# Patient Record
Sex: Female | Born: 1950 | Hispanic: No | Smoking: Never smoker
Health system: Southern US, Community
[De-identification: ages and names within clinical notes are randomized; demographics above are authoritative.]

## PROBLEM LIST (undated history)

## (undated) DIAGNOSIS — Z973 Presence of spectacles and contact lenses: Secondary | ICD-10-CM

## (undated) DIAGNOSIS — I10 Essential (primary) hypertension: Secondary | ICD-10-CM

## (undated) DIAGNOSIS — Z8619 Personal history of other infectious and parasitic diseases: Secondary | ICD-10-CM

## (undated) DIAGNOSIS — Z98811 Dental restoration status: Secondary | ICD-10-CM

## (undated) HISTORY — PX: VULVA SURGERY: SHX837

## (undated) HISTORY — PX: ENDOMETRIAL ABLATION: SHX621

---

## 1999-06-01 ENCOUNTER — Encounter: Payer: Self-pay | Admitting: Obstetrics and Gynecology

## 1999-06-01 ENCOUNTER — Ambulatory Visit (HOSPITAL_COMMUNITY): Admission: RE | Admit: 1999-06-01 | Discharge: 1999-06-01 | Payer: Self-pay | Admitting: Obstetrics and Gynecology

## 1999-07-13 ENCOUNTER — Encounter: Payer: Self-pay | Admitting: Obstetrics and Gynecology

## 1999-07-13 ENCOUNTER — Ambulatory Visit (HOSPITAL_COMMUNITY): Admission: RE | Admit: 1999-07-13 | Discharge: 1999-07-13 | Payer: Self-pay | Admitting: Obstetrics and Gynecology

## 1999-08-03 ENCOUNTER — Encounter: Payer: Self-pay | Admitting: Obstetrics and Gynecology

## 1999-08-03 ENCOUNTER — Observation Stay (HOSPITAL_COMMUNITY): Admission: RE | Admit: 1999-08-03 | Discharge: 1999-08-04 | Payer: Self-pay | Admitting: Obstetrics and Gynecology

## 1999-09-11 ENCOUNTER — Ambulatory Visit (HOSPITAL_COMMUNITY): Admission: RE | Admit: 1999-09-11 | Discharge: 1999-09-11 | Payer: Self-pay | Admitting: Obstetrics and Gynecology

## 1999-09-11 ENCOUNTER — Encounter: Payer: Self-pay | Admitting: Obstetrics and Gynecology

## 2000-01-11 ENCOUNTER — Encounter: Payer: Self-pay | Admitting: Obstetrics and Gynecology

## 2000-01-11 ENCOUNTER — Ambulatory Visit (HOSPITAL_COMMUNITY): Admission: RE | Admit: 2000-01-11 | Discharge: 2000-01-11 | Payer: Self-pay | Admitting: Obstetrics and Gynecology

## 2000-05-10 ENCOUNTER — Other Ambulatory Visit: Admission: RE | Admit: 2000-05-10 | Discharge: 2000-05-10 | Payer: Self-pay | Admitting: Obstetrics and Gynecology

## 2005-11-22 ENCOUNTER — Other Ambulatory Visit: Admission: RE | Admit: 2005-11-22 | Discharge: 2005-11-22 | Payer: Self-pay | Admitting: Obstetrics and Gynecology

## 2006-01-29 ENCOUNTER — Encounter: Admission: RE | Admit: 2006-01-29 | Discharge: 2006-01-29 | Payer: Self-pay | Admitting: Family Medicine

## 2007-02-04 ENCOUNTER — Encounter: Admission: RE | Admit: 2007-02-04 | Discharge: 2007-02-04 | Payer: Self-pay | Admitting: Family Medicine

## 2007-03-04 ENCOUNTER — Other Ambulatory Visit: Admission: RE | Admit: 2007-03-04 | Discharge: 2007-03-04 | Payer: Self-pay | Admitting: Family Medicine

## 2008-09-24 ENCOUNTER — Emergency Department (HOSPITAL_COMMUNITY): Admission: EM | Admit: 2008-09-24 | Discharge: 2008-09-24 | Payer: Self-pay | Admitting: Emergency Medicine

## 2008-11-17 ENCOUNTER — Other Ambulatory Visit: Admission: RE | Admit: 2008-11-17 | Discharge: 2008-11-17 | Payer: Self-pay | Admitting: Family Medicine

## 2009-10-08 HISTORY — PX: DIRECT LARYNGOSCOPY: SHX5326

## 2010-03-13 ENCOUNTER — Ambulatory Visit (HOSPITAL_BASED_OUTPATIENT_CLINIC_OR_DEPARTMENT_OTHER): Admission: RE | Admit: 2010-03-13 | Discharge: 2010-03-13 | Payer: Self-pay | Admitting: Otolaryngology

## 2010-12-25 LAB — POCT I-STAT, CHEM 8
BUN: 9 mg/dL (ref 6–23)
HCT: 38 % (ref 36.0–46.0)
Hemoglobin: 12.9 g/dL (ref 12.0–15.0)
Potassium: 3.2 mEq/L — ABNORMAL LOW (ref 3.5–5.1)
Sodium: 141 mEq/L (ref 135–145)
TCO2: 30 mmol/L (ref 0–100)

## 2011-08-10 ENCOUNTER — Other Ambulatory Visit (HOSPITAL_COMMUNITY)
Admission: RE | Admit: 2011-08-10 | Discharge: 2011-08-10 | Disposition: A | Discharge status: Home / Self Care | Payer: PRIVATE HEALTH INSURANCE | Source: Ambulatory Visit | Attending: Family Medicine | Admitting: Family Medicine

## 2011-08-10 ENCOUNTER — Other Ambulatory Visit: Payer: Self-pay | Admitting: Physician Assistant

## 2011-08-10 DIAGNOSIS — Z0000 Encounter for general adult medical examination without abnormal findings: Secondary | ICD-10-CM | POA: Insufficient documentation

## 2011-08-24 ENCOUNTER — Other Ambulatory Visit: Payer: Self-pay | Admitting: Surgery

## 2013-11-06 ENCOUNTER — Emergency Department (HOSPITAL_COMMUNITY)
Admission: EM | Admit: 2013-11-06 | Discharge: 2013-11-06 | Disposition: A | Discharge status: Home / Self Care | Payer: PRIVATE HEALTH INSURANCE | Attending: Emergency Medicine | Admitting: Emergency Medicine

## 2013-11-06 ENCOUNTER — Encounter (HOSPITAL_COMMUNITY): Payer: Self-pay | Admitting: Emergency Medicine

## 2013-11-06 ENCOUNTER — Emergency Department (HOSPITAL_COMMUNITY): Payer: PRIVATE HEALTH INSURANCE

## 2013-11-06 DIAGNOSIS — S0993XA Unspecified injury of face, initial encounter: Secondary | ICD-10-CM | POA: Insufficient documentation

## 2013-11-06 DIAGNOSIS — I10 Essential (primary) hypertension: Secondary | ICD-10-CM | POA: Insufficient documentation

## 2013-11-06 DIAGNOSIS — Z23 Encounter for immunization: Secondary | ICD-10-CM | POA: Insufficient documentation

## 2013-11-06 DIAGNOSIS — Y92410 Unspecified street and highway as the place of occurrence of the external cause: Secondary | ICD-10-CM | POA: Insufficient documentation

## 2013-11-06 DIAGNOSIS — S62009A Unspecified fracture of navicular [scaphoid] bone of unspecified wrist, initial encounter for closed fracture: Secondary | ICD-10-CM | POA: Insufficient documentation

## 2013-11-06 DIAGNOSIS — S62001A Unspecified fracture of navicular [scaphoid] bone of right wrist, initial encounter for closed fracture: Secondary | ICD-10-CM

## 2013-11-06 DIAGNOSIS — Y9389 Activity, other specified: Secondary | ICD-10-CM | POA: Insufficient documentation

## 2013-11-06 DIAGNOSIS — S199XXA Unspecified injury of neck, initial encounter: Secondary | ICD-10-CM

## 2013-11-06 HISTORY — DX: Essential (primary) hypertension: I10

## 2013-11-06 MED ORDER — IBUPROFEN 800 MG PO TABS: 800.0000 mg | ORAL_TABLET | Freq: Three times a day (TID) | ORAL | Status: DC

## 2013-11-06 MED ORDER — HYDROCODONE-ACETAMINOPHEN 5-325 MG PO TABS: 1.0000 | ORAL_TABLET | ORAL | Status: DC | PRN

## 2013-11-06 MED ORDER — TETANUS-DIPHTH-ACELL PERTUSSIS 5-2.5-18.5 LF-MCG/0.5 IM SUSP
0.5000 mL | Freq: Once | INTRAMUSCULAR | Status: AC
  Administered 2013-11-06: 0.5 mL via INTRAMUSCULAR
  Filled 2013-11-06: qty 0.5

## 2013-11-06 MED ORDER — ONDANSETRON 4 MG PO TBDP
4.0000 mg | ORAL_TABLET | Freq: Once | ORAL | Status: AC
Start: 2013-11-06 — End: 2013-11-06
  Administered 2013-11-06: 4 mg via ORAL
  Filled 2013-11-06: qty 1

## 2013-11-06 MED ORDER — IBUPROFEN 800 MG PO TABS
800.0000 mg | ORAL_TABLET | Freq: Once | ORAL | Status: AC
  Administered 2013-11-06: 800 mg via ORAL
  Filled 2013-11-06: qty 1
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
## 2013-11-06 NOTE — Progress Notes (Signed)
Orthopedic Tech Progress Note Patient Details:  Kara Nelson January 08, 1951 [id number]  Ortho Devices Type of Ortho Device: Thumb velcro splint Ortho Device/Splint Location: RUE Ortho Device/Splint Interventions: Ordered;Application   Braulio Bosch 11/06/2013, 9:38 PM

## 2013-11-06 NOTE — Discharge Instructions (Signed)
Follow up with the hand surgeon as scheduled.  Take motrin or tylenol as needed for pain. Scaphoid Fracture, Wrist A fracture is a break in the bone. The bone you have broken often does not show up as a fracture on x-ray until later on in the healing phase. This bone is called the scaphoid bone. With this bone, your caregiver will often cast or splint your wrist as though it is fractured, even if a fracture is not seen on the x-ray. This is often done with wrist injuries in which there is tenderness at the base of the thumb. An x-ray at 1-3 weeks after your injury may confirm this fracture. A cast or splint is used to protect and keep your injured bone in good position for healing. The cast or splint will be on generally for about 6 to 16 weeks, depending on your health, age, the fracture location and how quickly you heal. Another name for the scaphoid bone is the navicular bone. HOME CARE INSTRUCTIONS   To lessen the swelling and pain, keep the injured part elevated above your heart while sitting or lying down.  Apply ice to the injury for 15-20 minutes, 03-04 times per day while awake, for 2 days. Put the ice in a plastic bag and place a thin towel between the bag of ice and your cast.  If you have a plaster or fiberglass cast or splint:  Do not try to scratch the skin under the cast using sharp or pointed objects.  Check the skin around the cast every day. You may put lotion on any red or sore areas.  Keep your cast or splint dry and clean.  If you have a plaster splint:  Wear the splint as directed.  You may loosen the elastic bandage around the splint if your fingers become numb, tingle, or turn cold or blue.  If you have been put in a removable splint, wear and use as directed.  Do not put pressure on any part of your cast or splint; it may deform or break. Rest your cast or splint only on a pillow the first 24 hours until it is fully hardened.  Your cast or splint can be protected  during bathing with a plastic bag. Do not lower the cast or splint into water.  Only take over-the-counter or prescription medicines for pain, discomfort, or fever as directed by your caregiver.  If your caregiver has given you a follow up appointment, it is very important to keep that appointment. Not keeping the appointment could result in chronic pain and decreased function. If there is any problem keeping the appointment, you must call back to this facility for assistance. SEEK IMMEDIATE MEDICAL CARE IF:   Your cast gets damaged, wet or breaks.  You have continued severe pain or more swelling than you did before the cast or splint was put on.  Your skin or nails below the injury turn blue or gray, or feel cold or numb.  You have tingling or burning pain in your fingers or increasing pain with movement of your fingers Document Released: 09/14/2002 Document Revised: 12/17/2011 Document Reviewed: 05/13/2009 [facility] Patient Information 2014 [place].
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
## 2014-01-05 ENCOUNTER — Other Ambulatory Visit: Payer: Self-pay | Admitting: Orthopaedic Surgery

## 2014-01-05 DIAGNOSIS — M25531 Pain in right wrist: Secondary | ICD-10-CM

## 2014-01-11 ENCOUNTER — Ambulatory Visit
Admission: RE | Admit: 2014-01-11 | Discharge: 2014-01-11 | Disposition: A | Discharge status: Home / Self Care | Payer: BC Managed Care – PPO | Source: Ambulatory Visit | Attending: Orthopaedic Surgery | Admitting: Orthopaedic Surgery

## 2014-01-11 DIAGNOSIS — M25531 Pain in right wrist: Secondary | ICD-10-CM

## 2014-02-04 ENCOUNTER — Encounter (HOSPITAL_BASED_OUTPATIENT_CLINIC_OR_DEPARTMENT_OTHER)
Admission: RE | Admit: 2014-02-04 | Discharge: 2014-02-04 | Disposition: A | Discharge status: Home / Self Care | Payer: BC Managed Care – PPO | Source: Ambulatory Visit | Attending: Orthopedic Surgery | Admitting: Orthopedic Surgery

## 2014-02-04 ENCOUNTER — Encounter (HOSPITAL_BASED_OUTPATIENT_CLINIC_OR_DEPARTMENT_OTHER): Payer: Self-pay | Admitting: *Deleted

## 2014-02-04 DIAGNOSIS — Z01810 Encounter for preprocedural cardiovascular examination: Secondary | ICD-10-CM | POA: Insufficient documentation

## 2014-02-04 DIAGNOSIS — Z01812 Encounter for preprocedural laboratory examination: Secondary | ICD-10-CM | POA: Insufficient documentation

## 2014-02-04 LAB — BASIC METABOLIC PANEL
BUN: 15 mg/dL (ref 6–23)
CHLORIDE: 102 meq/L (ref 96–112)
CO2: 25 meq/L (ref 19–32)
Calcium: 8.6 mg/dL (ref 8.4–10.5)
Creatinine, Ser: 0.63 mg/dL (ref 0.50–1.10)
GFR calc Af Amer: >90 mL/min (ref >90)
GFR calc non Af Amer: >90 mL/min (ref >90)
GLUCOSE: 139 mg/dL — AB (ref 70–99)
Potassium: 3.6 mEq/L — ABNORMAL LOW (ref 3.7–5.3)
SODIUM: 140 meq/L (ref 137–147)

## 2014-02-04 NOTE — Progress Notes (Signed)
EKG reviewed by Dr Chriss Driver. No new orders
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
## 2014-02-05 ENCOUNTER — Other Ambulatory Visit: Payer: Self-pay | Admitting: Orthopedic Surgery
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
## 2014-02-09 ENCOUNTER — Encounter (HOSPITAL_BASED_OUTPATIENT_CLINIC_OR_DEPARTMENT_OTHER)
Admission: RE | Disposition: A | Discharge status: Home / Self Care | Payer: Self-pay | Source: Ambulatory Visit | Attending: Orthopedic Surgery

## 2014-02-09 ENCOUNTER — Ambulatory Visit (HOSPITAL_BASED_OUTPATIENT_CLINIC_OR_DEPARTMENT_OTHER)
Admission: RE | Admit: 2014-02-09 | Discharge: 2014-02-09 | Disposition: A | Discharge status: Home / Self Care | Payer: BC Managed Care – PPO | Source: Ambulatory Visit | Attending: Orthopedic Surgery | Admitting: Orthopedic Surgery

## 2014-02-09 ENCOUNTER — Encounter (HOSPITAL_BASED_OUTPATIENT_CLINIC_OR_DEPARTMENT_OTHER): Payer: Self-pay | Admitting: Orthopedic Surgery

## 2014-02-09 ENCOUNTER — Ambulatory Visit (HOSPITAL_BASED_OUTPATIENT_CLINIC_OR_DEPARTMENT_OTHER): Payer: BC Managed Care – PPO | Admitting: Anesthesiology

## 2014-02-09 ENCOUNTER — Encounter (HOSPITAL_BASED_OUTPATIENT_CLINIC_OR_DEPARTMENT_OTHER): Payer: BC Managed Care – PPO | Admitting: Anesthesiology

## 2014-02-09 DIAGNOSIS — Z9119 Patient's noncompliance with other medical treatment and regimen: Secondary | ICD-10-CM | POA: Insufficient documentation

## 2014-02-09 DIAGNOSIS — B029 Zoster without complications: Secondary | ICD-10-CM | POA: Insufficient documentation

## 2014-02-09 DIAGNOSIS — Z91199 Patient's noncompliance with other medical treatment and regimen due to unspecified reason: Secondary | ICD-10-CM | POA: Insufficient documentation

## 2014-02-09 DIAGNOSIS — IMO0002 Reserved for concepts with insufficient information to code with codable children: Secondary | ICD-10-CM | POA: Insufficient documentation

## 2014-02-09 DIAGNOSIS — Z885 Allergy status to narcotic agent status: Secondary | ICD-10-CM | POA: Insufficient documentation

## 2014-02-09 DIAGNOSIS — Z9109 Other allergy status, other than to drugs and biological substances: Secondary | ICD-10-CM | POA: Insufficient documentation

## 2014-02-09 DIAGNOSIS — I10 Essential (primary) hypertension: Secondary | ICD-10-CM | POA: Insufficient documentation

## 2014-02-09 DIAGNOSIS — Z91018 Allergy to other foods: Secondary | ICD-10-CM | POA: Insufficient documentation

## 2014-02-09 HISTORY — PX: ORIF WRIST FRACTURE: SHX2133

## 2014-02-09 HISTORY — DX: Personal history of other infectious and parasitic diseases: Z86.19

## 2014-02-09 HISTORY — DX: Dental restoration status: Z98.811

## 2014-02-09 HISTORY — DX: Presence of spectacles and contact lenses: Z97.3

## 2014-02-09 LAB — POCT HEMOGLOBIN-HEMACUE: Hemoglobin: 13.7 g/dL (ref 12.0–15.0)

## 2014-02-09 SURGERY — OPEN REDUCTION INTERNAL FIXATION (ORIF) WRIST FRACTURE
Anesthesia: General | Site: Wrist | Laterality: Right

## 2014-02-09 MED ORDER — CEFAZOLIN SODIUM-DEXTROSE 2-3 GM-% IV SOLR: 2.0000 g | INTRAVENOUS | Status: DC

## 2014-02-09 MED ORDER — LIDOCAINE HCL (CARDIAC) 20 MG/ML IV SOLN
INTRAVENOUS | Status: DC | PRN
  Administered 2014-02-09: 40 mg via INTRAVENOUS

## 2014-02-09 MED ORDER — VANCOMYCIN HCL 500 MG IV SOLR: 500.0000 mg | INTRAVENOUS | Status: DC

## 2014-02-09 MED ORDER — FENTANYL CITRATE 0.05 MG/ML IJ SOLN
INTRAMUSCULAR | Status: DC | PRN
  Administered 2014-02-09: 50 ug via INTRAVENOUS

## 2014-02-09 MED ORDER — FENTANYL CITRATE 0.05 MG/ML IJ SOLN
INTRAMUSCULAR | Status: AC
  Filled 2014-02-09: qty 4

## 2014-02-09 MED ORDER — MIDAZOLAM HCL 2 MG/2ML IJ SOLN
INTRAMUSCULAR | Status: AC
  Filled 2014-02-09: qty 2

## 2014-02-09 MED ORDER — VANCOMYCIN HCL IN DEXTROSE 500-5 MG/100ML-% IV SOLN
INTRAVENOUS | Status: AC
  Filled 2014-02-09: qty 100

## 2014-02-09 MED ORDER — ONDANSETRON HCL 4 MG/2ML IJ SOLN: 4.0000 mg | Freq: Once | INTRAMUSCULAR | Status: DC | PRN

## 2014-02-09 MED ORDER — TRAMADOL HCL 50 MG PO TABS: ORAL_TABLET | ORAL | Status: DC

## 2014-02-09 MED ORDER — MIDAZOLAM HCL 2 MG/2ML IJ SOLN
1.0000 mg | INTRAMUSCULAR | Status: DC | PRN
  Administered 2014-02-09: 2 mg via INTRAVENOUS

## 2014-02-09 MED ORDER — PROPOFOL 10 MG/ML IV BOLUS
INTRAVENOUS | Status: DC | PRN
  Administered 2014-02-09: 100 mg via INTRAVENOUS

## 2014-02-09 MED ORDER — MIDAZOLAM HCL 5 MG/5ML IJ SOLN
INTRAMUSCULAR | Status: DC | PRN
  Administered 2014-02-09: 2 mg via INTRAVENOUS

## 2014-02-09 MED ORDER — CEPHALEXIN 500 MG PO CAPS: 500.0000 mg | ORAL_CAPSULE | Freq: Three times a day (TID) | ORAL | Status: DC

## 2014-02-09 MED ORDER — DOXYCYCLINE HYCLATE 100 MG PO TABS: 100.0000 mg | ORAL_TABLET | Freq: Two times a day (BID) | ORAL | Status: DC

## 2014-02-09 MED ORDER — FENTANYL CITRATE 0.05 MG/ML IJ SOLN
INTRAMUSCULAR | Status: AC
  Filled 2014-02-09: qty 2

## 2014-02-09 MED ORDER — METHYLPREDNISOLONE ACETATE 40 MG/ML IJ SUSP
INTRAMUSCULAR | Status: AC
  Filled 2014-02-09: qty 1

## 2014-02-09 MED ORDER — CEFAZOLIN SODIUM-DEXTROSE 2-3 GM-% IV SOLR
INTRAVENOUS | Status: AC
  Filled 2014-02-09: qty 50

## 2014-02-09 MED ORDER — FENTANYL CITRATE 0.05 MG/ML IJ SOLN: 25.0000 ug | INTRAMUSCULAR | Status: DC | PRN

## 2014-02-09 MED ORDER — LIDOCAINE HCL 2 % IJ SOLN
INTRAMUSCULAR | Status: AC
  Filled 2014-02-09: qty 20

## 2014-02-09 MED ORDER — LACTATED RINGERS IV SOLN
INTRAVENOUS | Status: DC
  Administered 2014-02-09 (×2): via INTRAVENOUS

## 2014-02-09 MED ORDER — CHLORHEXIDINE GLUCONATE 4 % EX LIQD: 60.0000 mL | Freq: Once | CUTANEOUS | Status: DC

## 2014-02-09 MED ORDER — FENTANYL CITRATE 0.05 MG/ML IJ SOLN
50.0000 ug | INTRAMUSCULAR | Status: DC | PRN
  Administered 2014-02-09: 100 ug via INTRAVENOUS

## 2014-02-09 MED ORDER — VANCOMYCIN HCL 1000 MG IV SOLR
500.0000 mg | INTRAVENOUS | Status: DC | PRN
  Administered 2014-02-09: 500 mg via INTRAVENOUS

## 2014-02-09 MED ORDER — DEXAMETHASONE SODIUM PHOSPHATE 4 MG/ML IJ SOLN
INTRAMUSCULAR | Status: DC | PRN
  Administered 2014-02-09: 10 mg via INTRAVENOUS

## 2014-02-09 SURGICAL SUPPLY — 68 items
BAG DECANTER FOR FLEXI CONT (MISCELLANEOUS) IMPLANT
BANDAGE ADH SHEER 1  50/CT (GAUZE/BANDAGES/DRESSINGS) IMPLANT
BANDAGE ELASTIC 3 VELCRO ST LF (GAUZE/BANDAGES/DRESSINGS) ×4 IMPLANT
BIT DRILL MICR ACTRK 2 LNG PRF (BIT) IMPLANT
BLADE 15 SAFETY STRL DISP (BLADE) ×2 IMPLANT
BLADE MINI RND TIP GREEN BEAV (BLADE) ×1 IMPLANT
BLADE SURG 15 STRL LF DISP TIS (BLADE) IMPLANT
BLADE SURG 15 STRL SS (BLADE) ×2
BNDG CMPR 9X4 STRL LF SNTH (GAUZE/BANDAGES/DRESSINGS) ×1
BNDG COHESIVE 3X5 TAN STRL LF (GAUZE/BANDAGES/DRESSINGS) IMPLANT
BNDG ESMARK 4X9 LF (GAUZE/BANDAGES/DRESSINGS) ×2 IMPLANT
BNDG GAUZE ELAST 4 BULKY (GAUZE/BANDAGES/DRESSINGS) ×2 IMPLANT
BRUSH SCRUB EZ PLAIN DRY (MISCELLANEOUS) ×2 IMPLANT
CANISTER SUCT 1200ML W/VALVE (MISCELLANEOUS) ×1 IMPLANT
CORDS BIPOLAR (ELECTRODE) ×2 IMPLANT
COVER MAYO STAND STRL (DRAPES) ×2 IMPLANT
COVER TABLE BACK 60X90 (DRAPES) ×2 IMPLANT
CUFF TOURNIQUET SINGLE 18IN (TOURNIQUET CUFF) ×1 IMPLANT
DECANTER SPIKE VIAL GLASS SM (MISCELLANEOUS) IMPLANT
DRAPE EXTREMITY T 121X128X90 (DRAPE) ×2 IMPLANT
DRAPE OEC MINIVIEW 54X84 (DRAPES) ×2 IMPLANT
DRAPE SURG 17X23 STRL (DRAPES) ×2 IMPLANT
DRILL MICRO ACUTRAK 2 LNG PROF (BIT) ×2
GAUZE SPONGE 4X4 12PLY STRL (GAUZE/BANDAGES/DRESSINGS) ×2 IMPLANT
GAUZE XEROFORM 1X8 LF (GAUZE/BANDAGES/DRESSINGS) IMPLANT
GLOVE BIO SURGEON STRL SZ 6.5 (GLOVE) ×1 IMPLANT
GLOVE BIOGEL M STRL SZ7.5 (GLOVE) ×2 IMPLANT
GLOVE BIOGEL PI IND STRL 7.0 (GLOVE) IMPLANT
GLOVE BIOGEL PI INDICATOR 7.0 (GLOVE) ×1
GLOVE EXAM NITRILE MD LF STRL (GLOVE) ×1 IMPLANT
GLOVE ORTHO TXT STRL SZ7.5 (GLOVE) ×2 IMPLANT
GOWN STRL REUS W/ TWL LRG LVL3 (GOWN DISPOSABLE) ×1 IMPLANT
GOWN STRL REUS W/ TWL XL LVL3 (GOWN DISPOSABLE) ×2 IMPLANT
GOWN STRL REUS W/TWL LRG LVL3 (GOWN DISPOSABLE) ×2
GOWN STRL REUS W/TWL XL LVL3 (GOWN DISPOSABLE) ×4
K-WIRE .035X4 (WIRE) ×2 IMPLANT
LOOP VESSEL MAXI BLUE (MISCELLANEOUS) IMPLANT
NEEDLE 27GAX1X1/2 (NEEDLE) ×1 IMPLANT
NS IRRIG 1000ML POUR BTL (IV SOLUTION) ×2 IMPLANT
PACK BASIN DAY SURGERY FS (CUSTOM PROCEDURE TRAY) ×2 IMPLANT
PAD CAST 3X4 CTTN HI CHSV (CAST SUPPLIES) ×2 IMPLANT
PADDING CAST ABS 4INX4YD NS (CAST SUPPLIES) ×1
PADDING CAST ABS COTTON 4X4 ST (CAST SUPPLIES) ×1 IMPLANT
PADDING CAST COTTON 3X4 STRL (CAST SUPPLIES) ×4
SCREW ACUTRAK 2 MICRO 20MM (Screw) ×1 IMPLANT
SLEEVE SCD COMPRESS KNEE MED (MISCELLANEOUS) ×1 IMPLANT
SPLINT PLASTER CAST XFAST 3X15 (CAST SUPPLIES) IMPLANT
SPLINT PLASTER XTRA FASTSET 3X (CAST SUPPLIES) ×10
STOCKINETTE 4X48 STRL (DRAPES) ×2 IMPLANT
STRIP CLOSURE SKIN 1/2X4 (GAUZE/BANDAGES/DRESSINGS) ×2 IMPLANT
SUCTION FRAZIER TIP 10 FR DISP (SUCTIONS) IMPLANT
SUT ETHIBOND 3-0 V-5 (SUTURE) IMPLANT
SUT MERSILENE 4 0 P 3 (SUTURE) ×1 IMPLANT
SUT PROLENE 3 0 PS 2 (SUTURE) ×2 IMPLANT
SUT VIC AB 0 SH 27 (SUTURE) ×1 IMPLANT
SUT VIC AB 2-0 PS2 27 (SUTURE) ×1 IMPLANT
SUT VIC AB 3-0 SH 27 (SUTURE)
SUT VIC AB 3-0 SH 27X BRD (SUTURE) IMPLANT
SUT VIC AB 4-0 BRD 54 (SUTURE) IMPLANT
SUT VIC AB 4-0 P-3 18XBRD (SUTURE) IMPLANT
SUT VIC AB 4-0 P3 18 (SUTURE) ×2
SYR 3ML 23GX1 SAFETY (SYRINGE) IMPLANT
SYR BULB 3OZ (MISCELLANEOUS) ×2 IMPLANT
SYR CONTROL 10ML LL (SYRINGE) ×1 IMPLANT
TOWEL OR 17X24 6PK STRL BLUE (TOWEL DISPOSABLE) ×2 IMPLANT
TRAY DSU PREP LF (CUSTOM PROCEDURE TRAY) ×2 IMPLANT
TUBE CONNECTING 20X1/4 (TUBING) ×1 IMPLANT
UNDERPAD 30X30 INCONTINENT (UNDERPADS AND DIAPERS) ×2 IMPLANT

## 2014-02-09 NOTE — Progress Notes (Signed)
Assisted Dr. Linna Caprice with right, ultrasound guided, supraclavicular block. Side rails up, monitors on throughout procedure. See vital signs in flow sheet. Tolerated Procedure well.

## 2014-02-09 NOTE — Transfer of Care (Signed)
Immediate Anesthesia Transfer of Care Note  Patient: Kara Nelson  Procedure(s) Performed: Procedure(s) with comments: OPEN REDUCTION INTERNAL FIXATION (ORIF)/ ACUTRAK SCREW FIXATION RIGHT SCAPHOID (Right) - ANESTHESIA:  GENERAL/REGIONAL BLOCK  Patient Location: PACU  Anesthesia Type:GA combined with regional for post-op pain  Level of Consciousness: sedated  Airway & Oxygen Therapy: Patient Spontanous Breathing and Patient connected to face mask oxygen  Post-op Assessment: Report given to PACU RN and Post -op Vital signs reviewed and stable  Post vital signs: Reviewed and stable  Complications: No apparent anesthesia complications

## 2014-02-09 NOTE — Op Note (Signed)
[id number] 

## 2014-02-09 NOTE — Anesthesia Procedure Notes (Addendum)
Anesthesia Regional Block:  Interscalene brachial plexus block  Pre-Anesthetic Checklist: ,, timeout performed, Correct Patient, Correct Site, Correct Laterality, Correct Procedure, Correct Position, site marked, Risks and benefits discussed,  Surgical consent,  Pre-op evaluation,  At surgeon's request and post-op pain management  Laterality: Right  Prep: chloraprep       Needles:  Injection technique: Single-shot     Needle Length: 9cm 9 cm Needle Gauge: 22 and 22 G    Additional Needles: Interscalene brachial plexus block Narrative:  Start time: 02/09/2014 8:00 AM End time: 02/09/2014 8:05 AM Injection made incrementally with aspirations every 5 mL.  Performed by: Personally   Additional Notes: 20 cc naropin 0.5%  Injected easily   Procedure Name: LMA Insertion Date/Time: 02/09/2014 8:37 AM Performed by: Toula Moos L Pre-anesthesia Checklist: Patient identified, Emergency Drugs available, Suction available, Patient being monitored and Timeout performed Patient Re-evaluated:Patient Re-evaluated prior to inductionOxygen Delivery Method: Circle System Utilized Preoxygenation: Pre-oxygenation with 100% oxygen Intubation Type: IV induction Ventilation: Mask ventilation without difficulty LMA: LMA inserted LMA Size: 3.0 Number of attempts: 1 Airway Equipment and Method: bite block Placement Confirmation: positive ETCO2 and breath sounds checked- equal and bilateral Tube secured with: Tape Dental Injury: Teeth and Oropharynx as per pre-operative assessment

## 2014-02-09 NOTE — Brief Op Note (Signed)
02/09/2014  9:29 AM  PATIENT:  Skeet Simmer  63 y.o. female  PRE-OPERATIVE DIAGNOSIS:  RIGHT WRIST SCAPHOID NON UNION  POST-OPERATIVE DIAGNOSIS:  RIGHT WRIST SCAPHOID NON UNION  PROCEDURE:  Procedure(s) with comments: OPEN REDUCTION INTERNAL FIXATION (ORIF)/ ACUTRAK SCREW FIXATION RIGHT SCAPHOID (Right) - ANESTHESIA:  GENERAL/REGIONAL BLOCK  SURGEON:  Surgeon(s) and Role:    * Cammie Sickle., MD - Primary  PHYSICIAN ASSISTANT:   ASSISTANTS: Kathyrn Sheriff.A-C    ANESTHESIA:   general  EBL:  Total I/O In: 900 [I.V.:900] Out: -   BLOOD ADMINISTERED:none  DRAINS: none   LOCAL MEDICATIONS USED:  Regional block  SPECIMEN:  No Specimen  DISPOSITION OF SPECIMEN:  N/A  COUNTS:  YES  TOURNIQUET:  * Missing tourniquet times found for documented tourniquets in log:  [id number] *  DICTATION: .Other Dictation: Dictation Number [id number]  PLAN OF CARE: Discharge to home after PACU  PATIENT DISPOSITION:  PACU - hemodynamically stable.   Delay start of Pharmacological VTE agent (>24hrs) due to surgical blood loss or risk of bleeding: not applicable

## 2014-02-09 NOTE — Discharge Instructions (Addendum)
[facility] Center Instructions Hand Surgery  Wound Care: Keep your hand elevated above the level of your heart.  Do not allow it to dangle by your side.  Keep the dressing dry and do not remove it unless your doctor advises you to do so.  He will usually change it at the time of your post-op visit.  Moving your fingers is advised to stimulate circulation but will depend on the site of your surgery.  If you have a splint applied, your doctor will advise you regarding movement.  Activity: Do not drive or operate machinery today.  Rest today and then you may return to your normal activity and work as indicated by your physician.  Diet:  Drink liquids today or eat a light diet.  You may resume a regular diet tomorrow.    General expectations: Pain for two to three days. Fingers may become slightly swollen.  Call your doctor if any of the following occur: Severe pain not relieved by pain medication. Elevated temperature. Dressing soaked with blood. Inability to move fingers. White or bluish color to fingers.    Post Anesthesia Home Care Instructions  Activity: Get plenty of rest for the remainder of the day. A responsible adult should stay with you for 24 hours following the procedure.  For the next 24 hours, DO NOT: -Drive a car -Paediatric nurse -Drink alcoholic beverages -Take any medication unless instructed by your physician -Make any legal decisions or sign important papers.  Meals: Start with liquid foods such as gelatin or soup. Progress to regular foods as tolerated. Avoid greasy, spicy, heavy foods. If nausea and/or vomiting occur, drink only clear liquids until the nausea and/or vomiting subsides. Call your physician if vomiting continues.  Special Instructions/Symptoms: Your throat may feel dry or sore from the anesthesia or the breathing tube placed in your throat during surgery. If this causes discomfort, gargle with warm salt water. The discomfort should disappear within  24 hours.  Regional Anesthesia Blocks  1. Numbness or the inability to move the "blocked" extremity may last from 3-48 hours after placement. The length of time depends on the medication injected and your individual response to the medication. If the numbness is not going away after 48 hours, call your surgeon.  2. The extremity that is blocked will need to be protected until the numbness is gone and the  Strength has returned. Because you cannot feel it, you will need to take extra care to avoid injury. Because it may be weak, you may have difficulty moving it or using it. You may not know what position it is in without looking at it while the block is in effect.  3. For blocks in the legs and feet, returning to weight bearing and walking needs to be done carefully. You will need to wait until the numbness is entirely gone and the strength has returned. You should be able to move your leg and foot normally before you try and bear weight or walk. You will need someone to be with you when you first try to ensure you do not fall and possibly risk injury.  4. Bruising and tenderness at the needle site are common side effects and will resolve in a few days.  5. Persistent numbness or new problems with movement should be communicated to the surgeon or the [place] [phone] [place] [phone]).  Call your surgeon if you experience:   1.  Fever over 101.0. 2.  Inability to urinate.  3.  Nausea and/or vomiting. 4.  Extreme swelling or bruising at the surgical site. 5.  Continued bleeding from the incision. 6.  Increased pain, redness or drainage from the incision. 7.  Problems related to your pain medication.

## 2014-02-09 NOTE — Anesthesia Preprocedure Evaluation (Signed)
Anesthesia Evaluation  Patient identified by MRN, date of birth, ID band Patient awake    Reviewed: Allergy & Precautions, H&P , NPO status , Patient's Chart, lab work & pertinent test results  Airway Mallampati: II TM Distance: >3 FB     Dental  (+) Teeth Intact, Dental Advisory Given   Pulmonary  breath sounds clear to auscultation        Cardiovascular hypertension, Rhythm:Regular Rate:Normal     Neuro/Psych    GI/Hepatic   Endo/Other    Renal/GU      Musculoskeletal   Abdominal   Peds  Hematology   Anesthesia Other Findings   Reproductive/Obstetrics                           Anesthesia Physical Anesthesia Plan  ASA: II  Anesthesia Plan: General   Post-op Pain Management:    Induction: Intravenous  Airway Management Planned: LMA  Additional Equipment:   Intra-op Plan:   Post-operative Plan:   Informed Consent: I have reviewed the patients History and Physical, chart, labs and discussed the procedure including the risks, benefits and alternatives for the proposed anesthesia with the patient or authorized representative who has indicated his/her understanding and acceptance.   Dental advisory given  Plan Discussed with: CRNA and Anesthesiologist  Anesthesia Plan Comments:         Anesthesia Quick Evaluation

## 2014-02-09 NOTE — Progress Notes (Signed)
Anesthesiology The block performed was a Right supraclavicular block, not an interscalene block.

## 2014-02-09 NOTE — Anesthesia Postprocedure Evaluation (Signed)
  Anesthesia Post-op Note  Patient: Kara Nelson  Procedure(s) Performed: Procedure(s) with comments: OPEN REDUCTION INTERNAL FIXATION (ORIF)/ ACUTRAK SCREW FIXATION RIGHT SCAPHOID (Right) - ANESTHESIA:  GENERAL/REGIONAL BLOCK  Patient Location: PACU  Anesthesia Type:General and GA combined with regional for post-op pain  Level of Consciousness: awake, alert  and oriented  Airway and Oxygen Therapy: Patient Spontanous Breathing and Patient connected to nasal cannula oxygen  Post-op Pain: mild  Post-op Assessment: Post-op Vital signs reviewed, Patient's Cardiovascular Status Stable, Respiratory Function Stable, Patent Airway and Pain level controlled  Post-op Vital Signs: stable  Last Vitals:  Filed Vitals:   02/09/14 1130  BP: 164/88  Pulse: 66  Temp: 36.6 C  Resp: 16    Complications: No apparent anesthesia complications

## 2014-02-10 ENCOUNTER — Encounter (HOSPITAL_BASED_OUTPATIENT_CLINIC_OR_DEPARTMENT_OTHER): Payer: Self-pay | Admitting: Orthopedic Surgery
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
## 2014-08-05 ENCOUNTER — Other Ambulatory Visit: Payer: Self-pay | Admitting: Obstetrics and Gynecology

## 2014-08-05 ENCOUNTER — Other Ambulatory Visit (HOSPITAL_COMMUNITY)
Admission: RE | Admit: 2014-08-05 | Discharge: 2014-08-05 | Disposition: A | Discharge status: Home / Self Care | Payer: BC Managed Care – PPO | Source: Ambulatory Visit | Attending: Obstetrics and Gynecology | Admitting: Obstetrics and Gynecology

## 2014-08-05 DIAGNOSIS — Z01419 Encounter for gynecological examination (general) (routine) without abnormal findings: Secondary | ICD-10-CM | POA: Diagnosis present

## 2014-08-05 DIAGNOSIS — Z1151 Encounter for screening for human papillomavirus (HPV): Secondary | ICD-10-CM | POA: Insufficient documentation

## 2014-08-06 LAB — CYTOLOGY - PAP

## 2014-09-23 ENCOUNTER — Encounter (HOSPITAL_COMMUNITY)
Admission: RE | Admit: 2014-09-23 | Discharge: 2014-09-23 | Disposition: A | Discharge status: Home / Self Care | Payer: BC Managed Care – PPO | Source: Ambulatory Visit | Attending: Obstetrics and Gynecology | Admitting: Obstetrics and Gynecology

## 2014-09-23 ENCOUNTER — Encounter (HOSPITAL_COMMUNITY): Payer: Self-pay

## 2014-09-23 DIAGNOSIS — Z01812 Encounter for preprocedural laboratory examination: Secondary | ICD-10-CM | POA: Insufficient documentation

## 2014-09-23 LAB — BASIC METABOLIC PANEL
ANION GAP: 11 (ref 5–15)
BUN: 14 mg/dL (ref 6–23)
CALCIUM: 8.9 mg/dL (ref 8.4–10.5)
CO2: 28 meq/L (ref 19–32)
Chloride: 104 mEq/L (ref 96–112)
Creatinine, Ser: 0.67 mg/dL (ref 0.50–1.10)
GFR calc non Af Amer: >90 mL/min (ref >90)
Glucose, Bld: 91 mg/dL (ref 70–99)
Potassium: 3.7 mEq/L (ref 3.7–5.3)
Sodium: 143 mEq/L (ref 137–147)

## 2014-09-23 LAB — CBC
HCT: 38.8 % (ref 36.0–46.0)
Hemoglobin: 12.7 g/dL (ref 12.0–15.0)
MCH: 29 pg (ref 26.0–34.0)
MCHC: 32.7 g/dL (ref 30.0–36.0)
MCV: 88.6 fL (ref 78.0–100.0)
PLATELETS: 232 10*3/uL (ref 150–400)
RBC: 4.38 MIL/uL (ref 3.87–5.11)
RDW: 12.6 % (ref 11.5–15.5)
WBC: 4.1 10*3/uL (ref 4.0–10.5)

## 2014-09-23 NOTE — Patient Instructions (Signed)
Your procedure is scheduled on:09/27/14  Enter through the Main Entrance at :36 am Pick up desk phone and dial [phone] and inform us of your arrival.  Please call [phone] if you have any problems the morning of surgery.  Remember: Do not eat food after midnight:Sunday Clear liquids are ok until:8am on Monday   You may brush your teeth the morning of surgery.  Take these meds the morning of surgery with a sip of water:Benicar  DO NOT wear jewelry, eye make-up, lipstick,body lotion, or dark fingernail polish.  (Polished toes are ok) You may wear deodorant.  If you are to be admitted after surgery, leave suitcase in car until your room has been assigned. Patients discharged on the day of surgery will not be allowed to drive home. Wear loose fitting, comfortable clothes for your ride home.

## 2014-09-26 ENCOUNTER — Other Ambulatory Visit: Payer: Self-pay | Admitting: Obstetrics and Gynecology

## 2014-09-27 ENCOUNTER — Inpatient Hospital Stay (HOSPITAL_COMMUNITY)
Admission: RE | Admit: 2014-09-27 | Discharge: 2014-09-29 | DRG: 743 | Disposition: A | Discharge status: Home / Self Care | Payer: BC Managed Care – PPO | Source: Ambulatory Visit | Attending: Obstetrics and Gynecology | Admitting: Obstetrics and Gynecology

## 2014-09-27 ENCOUNTER — Encounter (HOSPITAL_COMMUNITY): Payer: Self-pay

## 2014-09-27 ENCOUNTER — Ambulatory Visit (HOSPITAL_COMMUNITY): Payer: BC Managed Care – PPO | Admitting: Certified Registered Nurse Anesthetist

## 2014-09-27 ENCOUNTER — Encounter (HOSPITAL_COMMUNITY)
Admission: RE | Disposition: A | Discharge status: Home / Self Care | Payer: Self-pay | Source: Ambulatory Visit | Attending: Obstetrics and Gynecology

## 2014-09-27 DIAGNOSIS — Z885 Allergy status to narcotic agent status: Secondary | ICD-10-CM | POA: Diagnosis not present

## 2014-09-27 DIAGNOSIS — N736 Female pelvic peritoneal adhesions (postinfective): Secondary | ICD-10-CM | POA: Diagnosis present

## 2014-09-27 DIAGNOSIS — Z888 Allergy status to other drugs, medicaments and biological substances status: Secondary | ICD-10-CM

## 2014-09-27 DIAGNOSIS — D219 Benign neoplasm of connective and other soft tissue, unspecified: Secondary | ICD-10-CM | POA: Diagnosis present

## 2014-09-27 DIAGNOSIS — Z90722 Acquired absence of ovaries, bilateral: Secondary | ICD-10-CM

## 2014-09-27 DIAGNOSIS — I10 Essential (primary) hypertension: Secondary | ICD-10-CM | POA: Diagnosis present

## 2014-09-27 DIAGNOSIS — D259 Leiomyoma of uterus, unspecified: Secondary | ICD-10-CM | POA: Diagnosis present

## 2014-09-27 DIAGNOSIS — Z90710 Acquired absence of both cervix and uterus: Secondary | ICD-10-CM

## 2014-09-27 DIAGNOSIS — R102 Pelvic and perineal pain: Secondary | ICD-10-CM | POA: Diagnosis present

## 2014-09-27 DIAGNOSIS — Z9079 Acquired absence of other genital organ(s): Secondary | ICD-10-CM

## 2014-09-27 HISTORY — PX: SALPINGOOPHORECTOMY: SHX82

## 2014-09-27 HISTORY — PX: FLEXIBLE SIGMOIDOSCOPY: SHX5431

## 2014-09-27 HISTORY — PX: ABDOMINAL HYSTERECTOMY: SHX81

## 2014-09-27 HISTORY — PX: ROBOTIC ASSISTED TOTAL HYSTERECTOMY WITH BILATERAL SALPINGO OOPHERECTOMY: SHX6086

## 2014-09-27 LAB — ABO/RH: ABO/RH(D): A POS

## 2014-09-27 LAB — PREPARE RBC (CROSSMATCH)

## 2014-09-27 SURGERY — ROBOTIC ASSISTED TOTAL HYSTERECTOMY WITH BILATERAL SALPINGO OOPHORECTOMY
Anesthesia: General | Site: Rectum | Laterality: Bilateral

## 2014-09-27 MED ORDER — ONDANSETRON HCL 4 MG/2ML IJ SOLN: 4.0000 mg | Freq: Four times a day (QID) | INTRAMUSCULAR | Status: DC | PRN

## 2014-09-27 MED ORDER — HYDROMORPHONE 0.3 MG/ML IV SOLN
INTRAVENOUS | Status: DC
Start: 2014-09-27 — End: 2014-09-28
  Administered 2014-09-27: 20:00:00 via INTRAVENOUS
  Administered 2014-09-28: 0.799 mg via INTRAVENOUS
  Administered 2014-09-28: 0.6 mg via INTRAVENOUS
  Administered 2014-09-28: 1.19 mg via INTRAVENOUS
  Filled 2014-09-27: qty 25

## 2014-09-27 MED ORDER — SODIUM CHLORIDE 0.9 % IJ SOLN: 9.0000 mL | INTRAMUSCULAR | Status: DC | PRN

## 2014-09-27 MED ORDER — DOCUSATE SODIUM 100 MG PO CAPS
100.0000 mg | ORAL_CAPSULE | Freq: Two times a day (BID) | ORAL | Status: DC
  Administered 2014-09-28 – 2014-09-29 (×3): 100 mg via ORAL
  Filled 2014-09-27 (×3): qty 1

## 2014-09-27 MED ORDER — GLYCOPYRROLATE 0.2 MG/ML IJ SOLN
INTRAMUSCULAR | Status: AC
  Filled 2014-09-27: qty 3

## 2014-09-27 MED ORDER — ROPIVACAINE HCL 5 MG/ML IJ SOLN
INTRAMUSCULAR | Status: DC | PRN
  Administered 2014-09-27: 109 mL

## 2014-09-27 MED ORDER — SCOPOLAMINE 1 MG/3DAYS TD PT72
MEDICATED_PATCH | TRANSDERMAL | Status: AC
  Administered 2014-09-27: 1.5 mg via TRANSDERMAL
  Filled 2014-09-27: qty 1

## 2014-09-27 MED ORDER — NEOSTIGMINE METHYLSULFATE 10 MG/10ML IV SOLN
INTRAVENOUS | Status: AC
  Filled 2014-09-27: qty 1

## 2014-09-27 MED ORDER — DEXAMETHASONE SODIUM PHOSPHATE 10 MG/ML IJ SOLN
INTRAMUSCULAR | Status: DC | PRN
  Administered 2014-09-27 (×2): 5 mg via INTRAVENOUS

## 2014-09-27 MED ORDER — EPHEDRINE 5 MG/ML INJ
INTRAVENOUS | Status: AC
  Filled 2014-09-27: qty 10

## 2014-09-27 MED ORDER — ONDANSETRON HCL 4 MG/2ML IJ SOLN
INTRAMUSCULAR | Status: AC
  Filled 2014-09-27: qty 2

## 2014-09-27 MED ORDER — FENTANYL CITRATE 0.05 MG/ML IJ SOLN
INTRAMUSCULAR | Status: AC
  Filled 2014-09-27: qty 2

## 2014-09-27 MED ORDER — ARTIFICIAL TEARS OP OINT
TOPICAL_OINTMENT | OPHTHALMIC | Status: AC
  Filled 2014-09-27: qty 3.5

## 2014-09-27 MED ORDER — NALOXONE HCL 0.4 MG/ML IJ SOLN
0.4000 mg | INTRAMUSCULAR | Status: DC | PRN
Start: 2014-09-27 — End: 2014-09-28

## 2014-09-27 MED ORDER — MIDAZOLAM HCL 2 MG/2ML IJ SOLN
INTRAMUSCULAR | Status: AC
  Filled 2014-09-27: qty 2

## 2014-09-27 MED ORDER — ALUM & MAG HYDROXIDE-SIMETH 200-200-20 MG/5ML PO SUSP: 30.0000 mL | ORAL | Status: DC | PRN

## 2014-09-27 MED ORDER — FENTANYL CITRATE 0.05 MG/ML IJ SOLN
INTRAMUSCULAR | Status: AC
  Filled 2014-09-27: qty 5

## 2014-09-27 MED ORDER — GLYCOPYRROLATE 0.2 MG/ML IJ SOLN
INTRAMUSCULAR | Status: DC | PRN
  Administered 2014-09-27: 0.1 mg via INTRAVENOUS
  Administered 2014-09-27: 0.4 mg via INTRAVENOUS
  Administered 2014-09-27: 0.1 mg via INTRAVENOUS

## 2014-09-27 MED ORDER — CEFAZOLIN SODIUM-DEXTROSE 2-3 GM-% IV SOLR
INTRAVENOUS | Status: AC
  Filled 2014-09-27: qty 50

## 2014-09-27 MED ORDER — MEPERIDINE HCL 25 MG/ML IJ SOLN: 6.2500 mg | INTRAMUSCULAR | Status: DC | PRN

## 2014-09-27 MED ORDER — DIPHENHYDRAMINE HCL 12.5 MG/5ML PO ELIX
12.5000 mg | ORAL_SOLUTION | Freq: Four times a day (QID) | ORAL | Status: DC | PRN
  Filled 2014-09-27: qty 5

## 2014-09-27 MED ORDER — LACTATED RINGERS IV SOLN
INTRAVENOUS | Status: DC
  Administered 2014-09-27: 1000 mL via INTRAVENOUS
  Administered 2014-09-27 (×4): via INTRAVENOUS

## 2014-09-27 MED ORDER — PROPOFOL 10 MG/ML IV BOLUS
INTRAVENOUS | Status: DC | PRN
  Administered 2014-09-27: 170 mg via INTRAVENOUS

## 2014-09-27 MED ORDER — SODIUM CHLORIDE 0.9 % IJ SOLN
INTRAMUSCULAR | Status: AC
  Filled 2014-09-27: qty 50

## 2014-09-27 MED ORDER — DEXAMETHASONE SODIUM PHOSPHATE 10 MG/ML IJ SOLN
INTRAMUSCULAR | Status: AC
  Filled 2014-09-27: qty 1

## 2014-09-27 MED ORDER — SCOPOLAMINE 1 MG/3DAYS TD PT72
1.0000 | MEDICATED_PATCH | Freq: Once | TRANSDERMAL | Status: AC
  Administered 2014-09-27: 1.5 mg via TRANSDERMAL
  Administered 2014-09-27: 1 via TRANSDERMAL

## 2014-09-27 MED ORDER — ROCURONIUM BROMIDE 100 MG/10ML IV SOLN
INTRAVENOUS | Status: AC
  Filled 2014-09-27: qty 1

## 2014-09-27 MED ORDER — ONDANSETRON HCL 4 MG PO TABS: 4.0000 mg | ORAL_TABLET | Freq: Four times a day (QID) | ORAL | Status: DC | PRN

## 2014-09-27 MED ORDER — LACTATED RINGERS IV SOLN
INTRAVENOUS | Status: DC
  Administered 2014-09-27: 20:00:00 via INTRAVENOUS

## 2014-09-27 MED ORDER — FENTANYL CITRATE 0.05 MG/ML IJ SOLN
25.0000 ug | INTRAMUSCULAR | Status: DC | PRN
  Administered 2014-09-27 (×3): 50 ug via INTRAVENOUS

## 2014-09-27 MED ORDER — METOCLOPRAMIDE HCL 5 MG/ML IJ SOLN: 10.0000 mg | Freq: Once | INTRAMUSCULAR | Status: DC | PRN

## 2014-09-27 MED ORDER — FENTANYL CITRATE 0.05 MG/ML IJ SOLN
INTRAMUSCULAR | Status: DC | PRN
  Administered 2014-09-27: 150 ug via INTRAVENOUS
  Administered 2014-09-27 (×6): 50 ug via INTRAVENOUS

## 2014-09-27 MED ORDER — LACTATED RINGERS IV SOLN: INTRAVENOUS | Status: DC

## 2014-09-27 MED ORDER — METHYLENE BLUE 1 % INJ SOLN
INTRAMUSCULAR | Status: DC | PRN
  Administered 2014-09-27: 1 mL

## 2014-09-27 MED ORDER — SIMETHICONE 80 MG PO CHEW: 80.0000 mg | CHEWABLE_TABLET | Freq: Four times a day (QID) | ORAL | Status: DC | PRN

## 2014-09-27 MED ORDER — NEOSTIGMINE METHYLSULFATE 10 MG/10ML IV SOLN
INTRAVENOUS | Status: DC | PRN
  Administered 2014-09-27: 3 mg via INTRAVENOUS

## 2014-09-27 MED ORDER — SODIUM CHLORIDE 0.9 % IV SOLN: 10.0000 mL/h | Freq: Once | INTRAVENOUS | Status: DC

## 2014-09-27 MED ORDER — STERILE WATER FOR IRRIGATION IR SOLN
Status: DC | PRN
  Administered 2014-09-27: 3000 mL

## 2014-09-27 MED ORDER — ROCURONIUM BROMIDE 100 MG/10ML IV SOLN
INTRAVENOUS | Status: DC | PRN
  Administered 2014-09-27 (×5): 10 mg via INTRAVENOUS
  Administered 2014-09-27: 40 mg via INTRAVENOUS
  Administered 2014-09-27: 10 mg via INTRAVENOUS

## 2014-09-27 MED ORDER — DIPHENHYDRAMINE HCL 50 MG/ML IJ SOLN: 12.5000 mg | Freq: Four times a day (QID) | INTRAMUSCULAR | Status: DC | PRN

## 2014-09-27 MED ORDER — LACTATED RINGERS IR SOLN
Status: DC | PRN
  Administered 2014-09-27: 3000 mL

## 2014-09-27 MED ORDER — OXYCODONE-ACETAMINOPHEN 5-325 MG PO TABS: 1.0000 | ORAL_TABLET | ORAL | Status: DC | PRN

## 2014-09-27 MED ORDER — LIDOCAINE HCL (CARDIAC) 20 MG/ML IV SOLN
INTRAVENOUS | Status: DC | PRN
  Administered 2014-09-27: 60 mg via INTRAVENOUS

## 2014-09-27 MED ORDER — MIDAZOLAM HCL 2 MG/2ML IJ SOLN
INTRAMUSCULAR | Status: DC | PRN
  Administered 2014-09-27 (×2): 1 mg via INTRAVENOUS

## 2014-09-27 MED ORDER — ONDANSETRON HCL 4 MG/2ML IJ SOLN
INTRAMUSCULAR | Status: DC | PRN
  Administered 2014-09-27 (×2): 2 mg via INTRAVENOUS

## 2014-09-27 MED ORDER — KETOROLAC TROMETHAMINE 30 MG/ML IJ SOLN
INTRAMUSCULAR | Status: AC
  Filled 2014-09-27: qty 1

## 2014-09-27 MED ORDER — PROPOFOL 10 MG/ML IV EMUL
INTRAVENOUS | Status: AC
  Filled 2014-09-27: qty 20

## 2014-09-27 MED ORDER — ROPIVACAINE HCL 5 MG/ML IJ SOLN
INTRAMUSCULAR | Status: AC
  Filled 2014-09-27: qty 60

## 2014-09-27 MED ORDER — METHYLENE BLUE 1 % INJ SOLN
INTRAMUSCULAR | Status: AC
Start: 2014-09-27 — End: 2014-09-27
  Filled 2014-09-27: qty 1

## 2014-09-27 MED ORDER — SODIUM CHLORIDE 0.9 % IJ SOLN
INTRAMUSCULAR | Status: AC
  Filled 2014-09-27: qty 10

## 2014-09-27 MED ORDER — IBUPROFEN 600 MG PO TABS
600.0000 mg | ORAL_TABLET | Freq: Four times a day (QID) | ORAL | Status: DC | PRN
  Administered 2014-09-28 – 2014-09-29 (×2): 600 mg via ORAL
  Filled 2014-09-27 (×3): qty 1

## 2014-09-27 MED ORDER — LIDOCAINE HCL (CARDIAC) 20 MG/ML IV SOLN
INTRAVENOUS | Status: AC
  Filled 2014-09-27: qty 5

## 2014-09-27 MED ORDER — EPHEDRINE SULFATE 50 MG/ML IJ SOLN
INTRAMUSCULAR | Status: DC | PRN
  Administered 2014-09-27: 10 mg via INTRAVENOUS

## 2014-09-27 MED ORDER — CEFAZOLIN SODIUM-DEXTROSE 2-3 GM-% IV SOLR
2.0000 g | INTRAVENOUS | Status: AC
  Administered 2014-09-27: 2 g via INTRAVENOUS

## 2014-09-27 SURGICAL SUPPLY — 77 items
APL SKNCLS STERI-STRIP NONHPOA (GAUZE/BANDAGES/DRESSINGS) ×3
BARRIER ADHS 3X4 INTERCEED (GAUZE/BANDAGES/DRESSINGS) ×4 IMPLANT
BENZOIN TINCTURE PRP APPL 2/3 (GAUZE/BANDAGES/DRESSINGS) ×4 IMPLANT
BRR ADH 4X3 ABS CNTRL BYND (GAUZE/BANDAGES/DRESSINGS) ×3
CATH FOLEY 3WAY  5CC 16FR (CATHETERS) ×1
CATH FOLEY 3WAY 5CC 16FR (CATHETERS) ×3 IMPLANT
CONT PATH 16OZ SNAP LID 3702 (MISCELLANEOUS) ×4 IMPLANT
COVER BACK TABLE 60X90IN (DRAPES) ×8 IMPLANT
COVER TIP SHEARS 8 DVNC (MISCELLANEOUS) ×3 IMPLANT
COVER TIP SHEARS 8MM DA VINCI (MISCELLANEOUS) ×1
DECANTER SPIKE VIAL GLASS SM (MISCELLANEOUS) ×4 IMPLANT
DILATOR CANAL MILEX (MISCELLANEOUS) ×4 IMPLANT
DISSECTOR SPONGE CHERRY (GAUZE/BANDAGES/DRESSINGS) ×1 IMPLANT
DRAPE WARM FLUID 44X44 (DRAPE) ×4 IMPLANT
DRSG COVADERM PLUS 2X2 (GAUZE/BANDAGES/DRESSINGS) ×16 IMPLANT
DRSG OPSITE POSTOP 3X4 (GAUZE/BANDAGES/DRESSINGS) ×4 IMPLANT
DURAPREP 26ML APPLICATOR (WOUND CARE) ×4 IMPLANT
ELECT REM PT RETURN 9FT ADLT (ELECTROSURGICAL) ×4
ELECTRODE REM PT RTRN 9FT ADLT (ELECTROSURGICAL) ×3 IMPLANT
EVACUATOR SMOKE 8.L (FILTER) ×4 IMPLANT
GAUZE SPONGE 4X4 16PLY XRAY LF (GAUZE/BANDAGES/DRESSINGS) ×1 IMPLANT
GAUZE VASELINE 3X9 (GAUZE/BANDAGES/DRESSINGS) IMPLANT
GLOVE BIOGEL M 6.5 STRL (GLOVE) ×12 IMPLANT
GLOVE BIOGEL PI IND STRL 6.5 (GLOVE) ×3 IMPLANT
GLOVE BIOGEL PI IND STRL 7.0 (GLOVE) ×12 IMPLANT
GLOVE BIOGEL PI INDICATOR 6.5 (GLOVE) ×1
GLOVE BIOGEL PI INDICATOR 7.0 (GLOVE) ×4
GOWN STRL REUS W/TWL LRG LVL3 (GOWN DISPOSABLE) ×40 IMPLANT
GYRUS RUMI II 2.5CM BLUE (DISPOSABLE) ×4
GYRUS RUMI II 3.5CM BLUE (DISPOSABLE)
GYRUS RUMI II 4.0CM BLUE (DISPOSABLE)
KIT ACCESSORY DA VINCI DISP (KITS) ×1
KIT ACCESSORY DVNC DISP (KITS) ×3 IMPLANT
LEGGING LITHOTOMY PAIR STRL (DRAPES) ×4 IMPLANT
OCCLUDER COLPOPNEUMO (BALLOONS) ×1 IMPLANT
PACK ROBOT WH (CUSTOM PROCEDURE TRAY) ×4 IMPLANT
PAD PREP 24X48 CUFFED NSTRL (MISCELLANEOUS) ×8 IMPLANT
PAD TRENDELENBURG POSITION (MISCELLANEOUS) ×4 IMPLANT
PROTECTOR NERVE ULNAR (MISCELLANEOUS) ×8 IMPLANT
RUMI II 3.0CM BLUE KOH-EFFICIE (DISPOSABLE) IMPLANT
RUMI II GYRUS 2.5CM BLUE (DISPOSABLE) IMPLANT
RUMI II GYRUS 3.5CM BLUE (DISPOSABLE) IMPLANT
RUMI II GYRUS 4.0CM BLUE (DISPOSABLE) IMPLANT
SET CYSTO W/LG BORE CLAMP LF (SET/KITS/TRAYS/PACK) ×4 IMPLANT
SET IRRIG TUBING LAPAROSCOPIC (IRRIGATION / IRRIGATOR) ×4 IMPLANT
SET TRI-LUMEN FLTR TB AIRSEAL (TUBING) ×2 IMPLANT
SPONGE LAP 18X18 X RAY DECT (DISPOSABLE) ×5 IMPLANT
STRIP CLOSURE SKIN 1/4X4 (GAUZE/BANDAGES/DRESSINGS) ×4 IMPLANT
SURGIFLO W/THROMBIN 8M KIT (HEMOSTASIS) ×1 IMPLANT
SUT SILK 3 0 SH 30 (SUTURE) ×2 IMPLANT
SUT VIC AB 0 CT1 27 (SUTURE) ×28
SUT VIC AB 0 CT1 27XBRD ANBCTR (SUTURE) ×6 IMPLANT
SUT VIC AB 0 CT1 27XBRD ANTBC (SUTURE) IMPLANT
SUT VIC AB 0 CT1 27XCR 8 STRN (SUTURE) IMPLANT
SUT VIC AB 0 CT1 36 (SUTURE) ×2 IMPLANT
SUT VIC AB 2-0 CT1 (SUTURE) ×1 IMPLANT
SUT VIC AB 4-0 KS 27 (SUTURE) ×1 IMPLANT
SUT VICRYL 0 27 CT2 27 ABS (SUTURE) ×20 IMPLANT
SUT VICRYL 0 TIES 12 18 (SUTURE) ×1 IMPLANT
SUT VICRYL 0 UR6 27IN ABS (SUTURE) ×4 IMPLANT
SUT VICRYL RAPIDE 4/0 PS 2 (SUTURE) ×9 IMPLANT
SYR 50ML LL SCALE MARK (SYRINGE) ×8 IMPLANT
TIP RUMI ORANGE 6.7MMX12CM (TIP) IMPLANT
TIP UTERINE 5.1X6CM LAV DISP (MISCELLANEOUS) IMPLANT
TIP UTERINE 6.7X10CM GRN DISP (MISCELLANEOUS) IMPLANT
TIP UTERINE 6.7X6CM WHT DISP (MISCELLANEOUS) IMPLANT
TIP UTERINE 6.7X8CM BLUE DISP (MISCELLANEOUS) ×1 IMPLANT
TOWEL OR 17X24 6PK STRL BLUE (TOWEL DISPOSABLE) ×12 IMPLANT
TROCAR 12M 150ML BLUNT (TROCAR) ×4 IMPLANT
TROCAR DISP BLADELESS 8 DVNC (TROCAR) ×3 IMPLANT
TROCAR DISP BLADELESS 8MM (TROCAR) ×1
TROCAR XCEL 12X100 BLDLESS (ENDOMECHANICALS) IMPLANT
TROCAR XCEL NON-BLD 11X100MML (ENDOMECHANICALS) ×4 IMPLANT
TUBING NON-CON 1/4 X 20 CONN (TUBING) ×1 IMPLANT
WARMER LAPAROSCOPE (MISCELLANEOUS) ×4 IMPLANT
WATER STERILE IRR 1000ML POUR (IV SOLUTION) ×12 IMPLANT
YANKAUER SUCT BULB TIP NO VENT (SUCTIONS) ×1 IMPLANT

## 2014-09-27 NOTE — H&P (Signed)
Date of Initial H&P: 09/27/2014  History reviewed, patient examined, no change in status, stable for surgery.

## 2014-09-27 NOTE — Op Note (Signed)
Preoperative diagnosis: Severe pelvic adhesions, rule out rectal injury  Postoperative diagnosis: Same, no apparent rectal injury  Procedure: Intraoperative consultation with mobilization of the rectosigmoid and rigid sigmoidoscopy  History: I was consulted intraoperatively by Dr. Christophe Louis due to severe pelvic adhesions during a robotic converted to open hysterectomy with the rectosigmoid densely adherent to the uterus and concern over possible rectosigmoid injury.  Description of procedure: The rectosigmoid was densely adherent to an enlarged fibroid uterus. A portion of the rectosigmoid had been dissected away from the uterus and initially appeared questionably thickened although I did not see an obvious injury. The left ureter wasn't initially identified and traced distally along the pelvic course and it was carefully protected throughout the remainder of the dissection. Using careful blunt and instrument dissection the rectosigmoid was dissected away from the posterior uterus working down toward the cervix until softer areolar tissue was encountered and it could be further bluntly dissected posteriorly to the point where Kara Nelson could continue with hysterectomy. At the completion of the hysterectomy I carefully examined the rectosigmoid and did not see any obvious injury. I then went below and rigid sigmoidoscopy was performed to about 15 cm which was above the level of the dissection and there was no blood or evidence of mucosal defect or abnormality.  With the proximal sigmoid occluded I then tensely distended the rectosigmoid with air using the sigmoidoscope and under saline irrigation there was no evidence of leak. The rectosigmoid was then desufflated and the scope removed. Kara Nelson continued with completion of the procedure.  Kara Jolly MD, FACS  09/27/2014, 7:04 PM

## 2014-09-27 NOTE — Transfer of Care (Signed)
Immediate Anesthesia Transfer of Care Note  Patient: Kara Nelson  Procedure(s) Performed: Procedure(s) with comments: ROBOTIC ASSISTED TOTAL HYSTERECTOMY WITH BILATERAL SALPINGO OOPHORECTOMY (Bilateral) - Attempted Robotic Procedure. Converted to open Case HYSTERECTOMY ABDOMINAL SALPINGO OOPHORECTOMY (Bilateral) FLEXIBLE SIGMOIDOSCOPY  Patient Location: PACU  Anesthesia Type:General  Level of Consciousness: sedated  Airway & Oxygen Therapy: Patient Spontanous Breathing and Patient connected to nasal cannula oxygen  Post-op Assessment: Report given to PACU RN and Post -op Vital signs reviewed and stable  Post vital signs: stable  Complications: No apparent anesthesia complications

## 2014-09-27 NOTE — Anesthesia Postprocedure Evaluation (Signed)
Anesthesia Post Note  Patient: Kara Nelson  Procedure(s) Performed: Procedure(s) (LRB): ROBOTIC ASSISTED TOTAL HYSTERECTOMY WITH BILATERAL SALPINGO OOPHORECTOMY (Bilateral) HYSTERECTOMY ABDOMINAL SALPINGO OOPHORECTOMY (Bilateral) FLEXIBLE SIGMOIDOSCOPY  Anesthesia type: General  Patient location: PACU  Post pain: Pain level controlled  Post assessment: Post-op Vital signs reviewed  Last Vitals:  Filed Vitals:   09/27/14 1845  BP: 140/80  Pulse: 73  Temp:   Resp: 20    Post vital signs: Reviewed  Level of consciousness: sedated  Complications: No apparent anesthesia complications

## 2014-09-27 NOTE — Addendum Note (Signed)
Addendum  created 09/27/14 2141 by Venia Carbon. Royce Macadamia, MD   Modules edited: Anesthesia Events, Narrator   Narrator:  Narrator: Event Log Edited

## 2014-09-27 NOTE — Op Note (Signed)
09/27/2014  6:30 PM  PATIENT:  Kara Nelson  63 y.o. female  PRE-OPERATIVE DIAGNOSIS:  FIBROIDS   POST-OPERATIVE DIAGNOSIS:  fibroids  PROCEDURE:  Procedure(s) with comments: ROBOTIC ASSISTED TOTAL HYSTERECTOMY WITH BILATERAL SALPINGO OOPHORECTOMY (Bilateral) - Attempted Robotic Procedure. Converted to open Case HYSTERECTOMY ABDOMINAL SALPINGO OOPHORECTOMY (Bilateral) FLEXIBLE SIGMOIDOSCOPY  SURGEON:  Surgeon(s) and Role:    * Thurnell Lose, MD - Assisting    * Maeola Sarah. Landry Mellow, Gorman, MD - Assisting  PHYSICIAN ASSISTANT:   ASSISTANTS: Dr. Thurnell Lose    ANESTHESIA:   general  EBL:  Total I/O In: 3800 [I.V.:3800] Out: 1150 [Urine:450; Blood:700]  BLOOD ADMINISTERED:none  DRAINS: Urinary Catheter (Foley)   LOCAL MEDICATIONS USED:  OTHER Ropivacaine   SPECIMEN:  Source of Specimen:  uterus cervix and bilateral fallopian tube and ovaries  DISPOSITION OF SPECIMEN:  PATHOLOGY  COUNTS:  YES  TOURNIQUET:  * No tourniquets in log *  DICTATION: .Dragon Dictation  PLAN OF CARE: Admit to inpatient   PATIENT DISPOSITION:  PACU - hemodynamically stable.   Delay start of Pharmacological VTE agent (>24 hrs) due to surgical blood loss or risk of bleeding: not applicable  Findings: Large uterine fibroids. Adhesions of the colon to the posterior aspect of the uterus. Normal fallopian tubes and ovaries.   Procedure: The  patient was taken to the operating room where she was placed under general anesthesia. She was placed in dorsal lithotomy position and prepped and draped in the usual sterile fashion. A weighted speculum was placed into the vagina.  A Deaver was placed anteriorly for retraction. The anterior lip of the cervix was grasped with a single-tooth tenaculum. The vaginal mucosa was injected with 2.5 cc of ropivacaine at the 2/4/ 8 and 10 o'clock  positions. The uterus was sounded to 8 cm the cervix was dilated to 6 mm . 0 vicryl suture   placed at the 12 and 6:00 positions  Of the cervix to facilitate placement of a Ru mi  uterine  manipulator. The manipulator was placed without difficulty. Weighted speculum and Deaver were removed .  Attention was turned to the patient's abdomen where a  12 mm skin incision was made 4 cm above the umbilicus..  A 12 mm trocar was placed under direct visualization . The pneumoperitoneum  was achieved with PCO2 gas.  The laparoscope was removed. 60 cc of ropivacaine were injected into the abdominal cavity. The laparoscope  was reinserted. An  20mm incision was made in the right upper quadrant and an 8 mm   trocar was placed 15 centimeters from the umbilicus.later connected to robotic arm #1). An 8MM incision was made in there  left upper quadrant TROCAR WAS PLACED 16 cm from the umbilicus. Later connected to robotic arm #2.  Attention was turned to the right upper quadrant where a 11 mm midclavicular assistant  trocar was placed. ( All incision sites were injected with 10 cc of ropivacaine prior to port placement. )  Once all ports had been placed under direct visualization.The laparoscope was removed and the [employer] robotic system was then right-side docked.  The robotic  arms were connected to the corresponding trocars as listed above. The laparoscope  was then reinserted.  The PK bipolar cautery was placed into port #1. The monopolar scissor placed in the port #2. All instruments were directed into the pelvis under direct visualization.  Attention  was turned to the surgeons console.. The left  mesosalpinx and left infundibulopelvic ligament was cauterized with PK and excised with scissors.The left ovary and tube were excised and removed through the assistant port.  The broad ligament was cauterized with PK incised with scissors. The round ligament was cauterized with the PK incised with scissors. The anterior leaf of broad ligament was incised along the bladder reflection to the midline.  The right  mesosalpinx and right infundibulopelvic  ligament was cauterized with PK and excised with scissors. The right fallopian tube and ovary were removed the assistant port. The right broad ligament was cauterized with PK excised scissors. The right round ligament was cauterized with PK and excised with scissors. The   broad ligament was incised  to the midline. The bladder was filled with sterile water stained with methylene blue.  The bladder was dissected off the lower uterine segments of the cervix via sharp and blunt dissection.  Attention was turned to the posterior cul-de-sac. The colon appeared to be densely adhered to the posterior uterus. I decided to convert to open approach due to the dense colonic adhesions. All instruments  removed from the ports.  All ports were removed under direct  Visualization. The  pneumoperitoneum was released.  The fascia of the 12 mm umbilical port was closed with 0 Vicryl and  the fascia the 11 mm port was closed with 0 Vicryl. The skin incisions were closed with 4-0 Vicryl and then covered with Derma bond.    A pfannenstiel incision was made with the scalpel and carried down to the underlying layer of fascia. The fascia was incised in the midline and extended laterally. The inferior  aspect of the fascia incision was grasped with kocher clamps and dissected from the rectus muscle. The was superior aspect of the fascial incision was grasped with kocher clamps and dissected from the rectus muscle. The peritoneum was tented up and entered sharply with Metzenbaum scissors.  O'Connor sullivan retractor was placed.  Bowel was packed away with moist laparotomy sponges. The uterine arteries were clamped with Heaney clamps and suture li gated with 0 vicryl. The colon was densely adhered to the posterior uterus. A general surgeon called to help with dense bowel adhesions. Initially it was thought that a bowel injury may have occurred during blunt dissection however this was not the  case. Dr. Excell Seltzer with general surgery arrived and performed sharp dissection of the colon from the posterior uterus. At this point the uterosacral ligaments ere clamped as the cervix and uterus were excised. The vaginal angles were suture li gated with 0 vicryl . The cuff was closed with 0 vicryl in a running locked fashion. The ureters were Identified and noted to peristals.. Dr. Excell Seltzer performed.  as well as flexible sigmoidoscopy. No bowel injury was noted.   The abdomen was copiously irrigated. The patient was noted to have bleeding from the left vaginal cuff this was grasped with Heaney clamp and suture li gated. Slight oozing as noted from the left pelvic side wall. Surgiflo was placed and excellent hemostasis was noted.   All instruments were removed from the abdomen. The fascia was closed with 0 vicryl. Skin was closed with 4-0 vicryl.    Sponge lap and needle counts were correct x2.  The patient was awakened from anesthesia and taken to the recovery room in stable condition.

## 2014-09-27 NOTE — Anesthesia Preprocedure Evaluation (Signed)
Anesthesia Evaluation  Patient identified by MRN, date of birth, ID band Patient awake    Reviewed: Allergy & Precautions, H&P , NPO status , Patient's Chart, lab work & pertinent test results, reviewed documented beta blocker date and time   Airway Mallampati: II  TM Distance: >3 FB Neck ROM: Full    Dental no notable dental hx. (+) Teeth Intact   Pulmonary neg pulmonary ROS,  breath sounds clear to auscultation  Pulmonary exam normal       Cardiovascular hypertension, Pt. on medications Rhythm:Regular Rate:Normal     Neuro/Psych negative neurological ROS  negative psych ROS   GI/Hepatic negative GI ROS, Neg liver ROS,   Endo/Other  negative endocrine ROS  Renal/GU negative Renal ROS  negative genitourinary   Musculoskeletal negative musculoskeletal ROS (+)   Abdominal   Peds  Hematology negative hematology ROS (+)   Anesthesia Other Findings   Reproductive/Obstetrics Symptomatic fibroids                             Anesthesia Physical Anesthesia Plan  ASA: II  Anesthesia Plan: General   Post-op Pain Management:    Induction: Intravenous  Airway Management Planned: Oral ETT  Additional Equipment:   Intra-op Plan:   Post-operative Plan: Extubation in OR  Informed Consent: I have reviewed the patients History and Physical, chart, labs and discussed the procedure including the risks, benefits and alternatives for the proposed anesthesia with the patient or authorized representative who has indicated his/her understanding and acceptance.   Dental advisory given  Plan Discussed with: Anesthesiologist, CRNA and Surgeon  Anesthesia Plan Comments:         Anesthesia Quick Evaluation

## 2014-09-27 NOTE — H&P (Signed)
Chief Complaint(s):   PreOp History and Physical for 09/27/14   HPI:  General 63 y/o with uterine fibroids and pelvic pressure presents for preop history and physical in preparation of robotic assisted laparoscopic hysterectomy and bilateral salpingectomy.  Current Medication:  Taking  Multivitamins Tablet 1 tablet daily     Calcium + D(Calcium-Vitamin D) 600-200 MG-UNIT Tablet 1 tablet Once a day     Magic Mouthwash liquid 1 tsp tid, prn     Telmisartan 40 MG Tablet 1 tablet Once a day     Vitamin B Complex Tablet     Medication List reviewed and reconciled with the patient   Medical History:   Hypertension     Allergic Rhinitis     fibroids   Allergies/Intolerance:   Codeine: Allergy - itch     Morphine Sulfate: Side Effects - insomina     Lisinopril: Side Effects - cough   Gyn History:   Sexual activity not currently sexually active. Periods : postmenopausal. LMP 2004. Last pap smear date 08/05/14. Last mammogram date 02/04/2007. Abnormal pap smear no. Menarche 25.   OB History:   Number of pregnancies 2. Pregnancy # 1 miscarriage. Pregnancy # 2 miscarriage.   Surgical History:   Bartholin's gland cyst excised 1991     Lipoma on back 1997     fibroid embolization 2001     Colonoscopy 12/28/11     right wrist surgery 10/2013   Hospitalization:   fibroids   Family History:   Father: deceased 15 yrs, stroke age 13-50, hypertension, diagnosed with HTN, CVA    Mother: deceased 72 yrs, alzheimer    Brother 1: deceased 77 yrs, Nasal cancer    Brother2: alive 83 yrs, HTN, some type of liver problem ???, diagnosed with HTN    Sister 1: alive 4 yrs, HTN, diagnosed with HTN    Sister 2: alive 17 yrs, Breast cancer, HTN, diagnosed with HTN, Breast Ca    Sister 3: alive 78 yrs    Sister 84: alive 5 yrs    Sister 5: deceased    2 brother(s) , 4 sister(s) .    denies family hx colon cancer, colon polyps.  Social History:  General Tobacco use cigarettes:  Never smoked, Tobacco history last updated 09/21/2014.  Alcohol: no.  Caffeine: tea, 2+ servings daily.  no Recreational drug use.  Diet: Vegetarian.  Exercise: daily: walks forty minutes.  Occupation: employed, Field seismologist.  Marital Status: single, Divorced.  Religion: Buddist.  ROS: Negative except as stated in HPI.   Objective:  Vitals:  Wt 111, Pulse sitting 85, BP sitting 132/85  Past Results:  Examination:  General Examination alert, oriented, NAD" label="GENERAL APPEARANCE" categoryPropId="[id number]" examid="[id number]"GENERAL APPEARANCE alert, oriented, NAD.  clear to auscultation bilaterally" label="LUNGS:" categoryPropId="87" examid="[id number]"LUNGS: clear to auscultation bilaterally.  regular rate and rhythm" label="HEART:" categoryPropId="86" examid="[id number]"HEART: regular rate and rhythm.  soft, non-tender/non-distended, bowel sounds present" label="ABDOMEN:" categoryPropId="88" examid="[id number]"ABDOMEN: soft, non-tender/non-distended, bowel sounds present.  normal external genitalia, labia - unremarkable, vagina - pink moist mucosa, no lesions or abnormal discharge, cervix - no discharge or lesions or CMT, adnexa - no masses or tenderness, uterus - nontender 16 wk size uterus freely mobile " label="FEMALE GENITOURINARY:" categoryPropId="13414" examid="[id number]"FEMALE GENITOURINARY: normal external genitalia, labia - unremarkable, vagina - pink moist mucosa, no lesions or abnormal discharge, cervix - no discharge or lesions or CMT, adnexa - no masses or tenderness, uterus - nontender 16 wk size uterus freely mobile .  no clubbing, no edema" label="EXTREMITIES:" categoryPropId="89" examid="[id number]"EXTREMITIES: no  clubbing, no edema.  Physical Examination: Chaperone present McCoy,Tiffany 09/21/2014 09:07:46 AM &gt; , for pelvic exam" label="Chaperone present"Chaperone present McCoy,Tiffany 09/21/2014 09:07:46 AM > , for pelvic exam.    Assessment:  Assessment:  Fibroid uterus - D25.9  (Primary)     Plan:  Treatment:  Fibroid uterus  Notes: patient has pelvic pressure and desires definitive therapy via hysterectomy. plan robotic hysterectomy with bilateral salpingectomy. r/b/a of surgery were discussed with the patient including but not limited to infection/ bleeding damage to bowel bladder ureters with the need for further surgery. r/o conversion to open hysterectomy discussed. pt voiced understanding and desires to proceed.

## 2014-09-27 NOTE — Progress Notes (Signed)
Urine output has been 800 cc's since surgery.  Dr. Raphael Gibney

## 2014-09-28 ENCOUNTER — Encounter (HOSPITAL_COMMUNITY): Payer: Self-pay | Admitting: Obstetrics and Gynecology

## 2014-09-28 LAB — CBC
HCT: 27.1 % — ABNORMAL LOW (ref 36.0–46.0)
HEMOGLOBIN: 9 g/dL — AB (ref 12.0–15.0)
MCH: 29.4 pg (ref 26.0–34.0)
MCHC: 33.2 g/dL (ref 30.0–36.0)
MCV: 88.6 fL (ref 78.0–100.0)
Platelets: 176 10*3/uL (ref 150–400)
RBC: 3.06 MIL/uL — ABNORMAL LOW (ref 3.87–5.11)
RDW: 12.8 % (ref 11.5–15.5)
WBC: 13.7 10*3/uL — ABNORMAL HIGH (ref 4.0–10.5)

## 2014-09-28 MED ORDER — HYDROMORPHONE HCL 2 MG PO TABS
2.0000 mg | ORAL_TABLET | ORAL | Status: DC | PRN
  Administered 2014-09-28: 2 mg via ORAL
  Filled 2014-09-28: qty 1

## 2014-09-28 MED ORDER — HYDROMORPHONE HCL 2 MG PO TABS
2.0000 mg | ORAL_TABLET | ORAL | Status: DC | PRN
  Administered 2014-09-28 – 2014-09-29 (×3): 4 mg via ORAL
  Filled 2014-09-28 (×3): qty 2

## 2014-09-28 NOTE — Progress Notes (Signed)
1 Day Post-Op Procedure(s) (LRB): ROBOTIC ASSISTED TOTAL HYSTERECTOMY WITH BILATERAL SALPINGO OOPHORECTOMY (Bilateral) HYSTERECTOMY ABDOMINAL SALPINGO OOPHORECTOMY (Bilateral) FLEXIBLE SIGMOIDOSCOPY  Subjective: Patient reports incisional pain, tolerating PO and no problems voiding.    Objective: I have reviewed patient's vital signs, intake and output, medications and labs.  General: alert, cooperative and no distress GI: soft appropriately tender + BS in all 4 quadrants.nondistended. incisions healing well  Extremities: extremities normal, atraumatic, no cyanosis or edema  Assessment: s/p Procedure(s) with comments: ROBOTIC ASSISTED TOTAL HYSTERECTOMY WITH BILATERAL SALPINGO OOPHORECTOMY (Bilateral) - Attempted Robotic Procedure. Converted to open Case HYSTERECTOMY ABDOMINAL SALPINGO OOPHORECTOMY (Bilateral) FLEXIBLE SIGMOIDOSCOPY: stable, progressing well and tolerating diet  Plan: Advance diet Encourage ambulation plan to increase po dilaudid to 2-4 mg po every 4 hours as needed.   Probable d/c home tomorrow  LOS: 1 day    Adryen Cookson J. 09/28/2014, 6:52 PM

## 2014-09-28 NOTE — Progress Notes (Signed)
Pt encouraged to use incentive spirometer and to deep breathe and cough. Pt states she cannot cough and breathe in deep because of pain. Pt has achieved only 1,000. Benefits of using the incentive spirometer were discussed.

## 2014-09-28 NOTE — Addendum Note (Signed)
Addendum  created 09/28/14 0741 by Talbot Grumbling, CRNA   Modules edited: Notes Section   Notes Section:  File: [id number]

## 2014-09-28 NOTE — Anesthesia Postprocedure Evaluation (Signed)
Anesthesia Post Note  Patient: Kara Nelson  Procedure(s) Performed: Procedure(s) (LRB): ROBOTIC ASSISTED TOTAL HYSTERECTOMY WITH BILATERAL SALPINGO OOPHORECTOMY (Bilateral) HYSTERECTOMY ABDOMINAL SALPINGO OOPHORECTOMY (Bilateral) FLEXIBLE SIGMOIDOSCOPY  Anesthesia type: General  Patient location: Mother/Baby  Post pain: Pain level controlled  Post assessment: Post-op Vital signs reviewed  Last Vitals:  Filed Vitals:   09/28/14 0657  BP:   Pulse:   Temp: 37.6 C  Resp:     Post vital signs: Reviewed  Level of consciousness: awake and alert   Complications: No apparent anesthesia complications

## 2014-09-29 MED ORDER — HYDROMORPHONE HCL 2 MG PO TABS: 2.0000 mg | ORAL_TABLET | ORAL | Status: AC | PRN

## 2014-09-29 MED ORDER — IBUPROFEN 600 MG PO TABS: 600.0000 mg | ORAL_TABLET | Freq: Four times a day (QID) | ORAL | Status: AC | PRN

## 2014-09-29 NOTE — Discharge Summary (Signed)
Physician Discharge Summary  Patient ID: Kara Nelson MRN: [id number] DOB/AGE: 61/08/1951 61 y.o.  Admit date: 09/27/2014 Discharge date: 09/29/2014  Admission Diagnoses:Uterine fibroids/ pelvic pain   Discharge Diagnoses:  Active Problems:   Fibroids   Pelvic adhesive disease   S/P TAH-BSO (total abdominal hysterectomy and bilateral salpingo-oophorectomy)   Discharged Condition: stable  Hospital Course: Pt was admitted on 09/27/2014 after undergoing robotic assisted laparoscopic surgery for hysterectomy that was converted to TAH/BSO due to pelvic adhesive disease. General Surgeon was called in due to dense adhesions of the colon to the posterior uterus. The colon was successfully dissected off the lower uterine segment without any bowel injury... She did well postoperatively with return of bowel and bladder function.   Consults: None  Significant Diagnostic Studies: labs: hgb 9.0  Treatments: surgery: robotic assisted laparoscopic surgery for hysterectomy that was converted to TAH/BSO   Discharge Exam: Blood pressure 109/62, pulse 98, temperature 99.3 F (37.4 C), temperature source Oral, resp. rate 18, height 5' (1.524 m), weight 51.256 kg (113 lb), SpO2 96 %. General appearance: alert and cooperative GI: soft appropriately tender nondistended + BS incision healing well  Extremities: extremities normal, atraumatic, no cyanosis or edema  Disposition: 01-Home or Self Care  Discharge Instructions    Call MD for:  persistant nausea and vomiting    Complete by:  As directed      Call MD for:  redness, tenderness, or signs of infection (pain, swelling, redness, odor or green/yellow discharge around incision site)    Complete by:  As directed      Call MD for:  severe uncontrolled pain    Complete by:  As directed      Call MD for:  temperature >100.4    Complete by:  As directed      Diet - low sodium heart healthy    Complete by:  As directed      Driving Restrictions     Complete by:  As directed   Avoid driving for 2 weeks     Increase activity slowly    Complete by:  As directed      Lifting restrictions    Complete by:  As directed   Avoid lifting over 10 lbs     Sexual Activity Restrictions    Complete by:  As directed   Avoid sex            Medication List    STOP taking these medications        doxycycline 100 MG tablet  Commonly known as:  VIBRA-TABS     traMADol 50 MG tablet  Commonly known as:  ULTRAM      TAKE these medications        acyclovir 400 MG tablet  Commonly known as:  ZOVIRAX  Take 400 mg by mouth 5 (five) times daily. As needed for cold sores     b complex vitamins tablet  Take 1 tablet by mouth daily. Takes as a packet.     HYDROmorphone 2 MG tablet  Commonly known as:  DILAUDID  Take 1-2 tablets (2-4 mg total) by mouth every 4 (four) hours as needed for severe pain.     ibuprofen 600 MG tablet  Commonly known as:  ADVIL,MOTRIN  Take 1 tablet (600 mg total) by mouth every 6 (six) hours as needed (mild pain).     magic mouthwash Soln  Take 5 mLs by mouth 4 (four) times daily as needed for mouth pain.  multivitamin with minerals Tabs tablet  Take 1 tablet by mouth daily. Takes as a packet.     NON FORMULARY  Take 1 packet by mouth daily. Vit Packet ( Vit B, Calcium, Vit D)     olmesartan 40 MG tablet  Commonly known as:  BENICAR  Take 40 mg by mouth daily.     triamcinolone 0.1 % paste  Commonly known as:  KENALOG  Use as directed 1 application in the mouth or throat 2 (two) times daily.           Follow-up Information    Follow up with Catha Brow., MD In 2 weeks.   Specialty:  Obstetrics and Gynecology   Why:  for postoperative visit . pt should already have an appt... call office to verify date [phone]   Contact information:   [address] [place] [address] [phone]       Signed: Catha Brow. 09/29/2014, 8:07 AM

## 2014-09-29 NOTE — Progress Notes (Signed)
Pt discharged home with friends... Discharge instructions reviewed with pt and she verbalized understanding... Condition stable... No equipment... Ambulated to car with Janalyn Shy, RN.

## 2014-10-01 LAB — TYPE AND SCREEN
ABO/RH(D): A POS
Antibody Screen: NEGATIVE
UNIT DIVISION: 0
Unit division: 0

## 2015-06-11 ENCOUNTER — Telehealth: Payer: Self-pay | Admitting: Emergency Medicine

## 2015-06-11 ENCOUNTER — Other Ambulatory Visit: Payer: Self-pay | Admitting: Emergency Medicine

## 2015-06-11 ENCOUNTER — Ambulatory Visit (INDEPENDENT_AMBULATORY_CARE_PROVIDER_SITE_OTHER): Payer: BLUE CROSS/BLUE SHIELD

## 2015-06-11 ENCOUNTER — Ambulatory Visit (INDEPENDENT_AMBULATORY_CARE_PROVIDER_SITE_OTHER): Payer: BLUE CROSS/BLUE SHIELD | Admitting: Emergency Medicine

## 2015-06-11 VITALS — BP 122/82 | HR 87 | Temp 98.5°F | Resp 16 | Ht 61.0 in | Wt 109.2 lb

## 2015-06-11 DIAGNOSIS — R918 Other nonspecific abnormal finding of lung field: Secondary | ICD-10-CM

## 2015-06-11 DIAGNOSIS — R059 Cough, unspecified: Secondary | ICD-10-CM

## 2015-06-11 DIAGNOSIS — R05 Cough: Secondary | ICD-10-CM

## 2015-06-11 DIAGNOSIS — J029 Acute pharyngitis, unspecified: Secondary | ICD-10-CM | POA: Diagnosis not present

## 2015-06-11 LAB — POCT CBC
Granulocyte percent: 79.1 %G (ref 37–80)
HCT, POC: 37 % — AB (ref 37.7–47.9)
Hemoglobin: 12.4 g/dL (ref 12.2–16.2)
Lymph, poc: 0.9 (ref 0.6–3.4)
MCH, POC: 28.1 pg (ref 27–31.2)
MCHC: 33.5 g/dL (ref 31.8–35.4)
MCV: 83.9 fL (ref 80–97)
MID (CBC): 0.4 (ref 0–0.9)
MPV: 6.8 fL (ref 0–99.8)
PLATELET COUNT, POC: 282 10*3/uL (ref 142–424)
POC Granulocyte: 4.9 (ref 2–6.9)
POC LYMPH %: 14.7 % (ref 10–50)
POC MID %: 6.2 %M (ref 0–12)
RBC: 4.41 M/uL (ref 4.04–5.48)
RDW, POC: 12.3 %
WBC: 6.2 10*3/uL (ref 4.6–10.2)

## 2015-06-11 LAB — POCT RAPID STREP A (OFFICE): Rapid Strep A Screen: NEGATIVE

## 2015-06-11 MED ORDER — BENZONATATE 100 MG PO CAPS: 100.0000 mg | ORAL_CAPSULE | Freq: Three times a day (TID) | ORAL | Status: AC | PRN

## 2015-06-11 MED ORDER — AZITHROMYCIN 250 MG PO TABS: ORAL_TABLET | ORAL | Status: DC

## 2015-06-11 NOTE — Patient Instructions (Signed)

## 2015-06-11 NOTE — Telephone Encounter (Signed)
Called and advised warm tea for sore throat. To ER if worsening or increased shortness of breath.Patient already advised of need for CT.for ? Of pulm. Nodules.

## 2015-06-11 NOTE — Progress Notes (Addendum)
Patient ID: Kara Nelson, female   DOB: Dec 15, 1950, 64 y.o.   MRN: [id number]    This chart was scribed for Kara Russian, MD by Ladene Artist, ED Scribe. The patient was seen in room 11. Patient's care was started at 11:29 AM.  Chief Complaint:  Chief Complaint  Patient presents with  . Cough    with discolored phlegm x's 5 days   HPI: Kara Nelson is a 64 y.o. female, with a h/o HTN, who reports to [facility] today complaining of persistent, gradually worsening sore throat for the past week, worsened 5 days ago. Pt reports associated productive cough with phlegm containing blood, bladder incontinence with coughing, chest pain only with coughing and chills. Pt further reports that cough keeps her up at night. She has tried OTC medications, expired Tessalon Perles and cough drops with temporary relief. She denies fever and wheezing. No previous inhaler use. Pt denies tobacco or alcohol use.   Past Medical History  Diagnosis Date  . Hypertension   . Wears glasses   . Dental crown present   . History of herpes zoster    Past Surgical History  Procedure Laterality Date  . Direct laryngoscopy  2011    bx  . Vulva surgery      cyst  . Endometrial ablation    . Orif wrist fracture Right 02/09/2014    Procedure: OPEN REDUCTION INTERNAL FIXATION (ORIF)/ ACUTRAK SCREW FIXATION RIGHT SCAPHOID;  Surgeon: Cammie Sickle., MD;  Location: [place];  Service: Orthopedics;  Laterality: Right;  ANESTHESIA:  GENERAL/REGIONAL BLOCK  . Robotic assisted total hysterectomy with bilateral salpingo oopherectomy Bilateral 09/27/2014    Procedure: ROBOTIC ASSISTED TOTAL HYSTERECTOMY WITH BILATERAL SALPINGO OOPHORECTOMY;  Surgeon: Maeola Sarah. Landry Mellow, MD;  Location: [facility];  Service: Gynecology;  Laterality: Bilateral;  Attempted Robotic Procedure. Converted to open Case  . Abdominal hysterectomy  09/27/2014    Procedure: HYSTERECTOMY ABDOMINAL;  Surgeon: Maeola Sarah. Landry Mellow, MD;  Location: [facility];  Service:  Gynecology;;  . Salpingoophorectomy Bilateral 09/27/2014    Procedure: SALPINGO OOPHORECTOMY;  Surgeon: Maeola Sarah. Landry Mellow, MD;  Location: [facility];  Service: Gynecology;  Laterality: Bilateral;  . Flexible sigmoidoscopy  09/27/2014    Procedure: FLEXIBLE SIGMOIDOSCOPY;  Surgeon: Maeola Sarah. Landry Mellow, MD;  Location: [facility];  Service: Gynecology;;   Social History   Social History  . Marital Status: Divorced    Spouse Name: N/A  . Number of Children: N/A  . Years of Education: N/A   Social History Main Topics  . Smoking status: Never Smoker   . Smokeless tobacco: None  . Alcohol Use: No  . Drug Use: No  . Sexual Activity: Not Asked   Other Topics Concern  . None   Social History Narrative   No family history on file. Allergies  Allergen Reactions  . Codeine     hives  . Lansoprazole Other (See Comments)    Combination lansoprazole,amoxicillin,clarithromycin  approx 10 years ago "had some type of reaction cannot take now". Not sure of type of reaction.  . Molds & Smuts     unknown  . Morphine And Related     Cannot sleep  . Other     Cantaloupe: hives  . Watermelon Flavor Hives   Prior to Admission medications   Medication Sig Start Date End Date Taking? Authorizing Provider  acyclovir (ZOVIRAX) 400 MG tablet Take 400 mg by mouth 5 (five) times daily. As needed for cold sores  Yes Historical Provider, MD  Alum & Mag Hydroxide-Simeth (MAGIC MOUTHWASH) SOLN Take 5 mLs by mouth 4 (four) times daily as needed for mouth pain.   Yes Historical Provider, MD  b complex vitamins tablet Take 1 tablet by mouth daily. Takes as a packet.   Yes Historical Provider, MD  HYDROmorphone (DILAUDID) 2 MG tablet Take 1-2 tablets (2-4 mg total) by mouth every 4 (four) hours as needed for severe pain. 09/29/14  Yes Christophe Louis, MD  ibuprofen (ADVIL,MOTRIN) 600 MG tablet Take 1 tablet (600 mg total) by mouth every 6 (six) hours as needed (mild pain). 09/29/14  Yes Christophe Louis, MD  Multiple Vitamin (MULTIVITAMIN  WITH MINERALS) TABS tablet Take 1 tablet by mouth daily. Takes as a packet.   Yes Historical Provider, MD  NON FORMULARY Take 1 packet by mouth daily. Vit Packet ( Vit B, Calcium, Vit D)   Yes Historical Provider, MD  olmesartan (BENICAR) 40 MG tablet Take 40 mg by mouth daily.   Yes Historical Provider, MD  triamcinolone (KENALOG) 0.1 % paste Use as directed 1 application in the mouth or throat 2 (two) times daily.   Yes Historical Provider, MD     ROS: The patient denies fevers, night sweats, unintentional weight loss, palpitations, wheezing, dyspnea on exertion, nausea, vomiting, abdominal pain, dysuria, hematuria, melena, numbness, weakness, or tingling. + cough, + sore throat, + chills, + chest pain (only with coughing)  All other systems have been reviewed and were otherwise negative with the exception of those mentioned in the HPI and as above.    PHYSICAL EXAM: Filed Vitals:   06/11/15 1103  BP: 122/82  Pulse: 87  Temp: 98.5 F (36.9 C)   Body mass index is 20.64 kg/(m^2).   General: Alert, no acute distress. Pt is wearing a mask with frequent episodes of cough. HEENT:  Normocephalic, atraumatic, oropharynx patent, throat is slightly red.  Eye: Juliette Mangle [facility] Cardiovascular:  Regular rate and rhythm, no rubs murmurs or gallops.  No Carotid bruits, radial pulse intact. No pedal edema.  Respiratory: Clear to auscultation bilaterally.  No wheezes or rales. Rhonchi in R upper lobe. No cyanosis, no use of accessory musculature Neck: Minimal bilateral tenderness in cervical area. Abdominal: No organomegaly, abdomen is soft and non-tender, positive bowel sounds. No masses. Musculoskeletal: Gait intact. No edema, tenderness Skin: No rashes. Neurologic: Facial musculature symmetric. Psychiatric: Patient acts appropriately throughout our interaction. Lymphatic: No cervical or submandibular lymphadenopathy Genitourinary/Anorectal: No acute findings   LABS: Results for orders placed  or performed in visit on 06/11/15  POCT rapid strep A  Result Value Ref Range   Rapid Strep A Screen Negative Negative  POCT CBC  Result Value Ref Range   WBC 6.2 4.6 - 10.2 K/uL   Lymph, poc 0.9 0.6 - 3.4   POC LYMPH PERCENT 14.7 10 - 50 %L   MID (cbc) 0.4 0 - 0.9   POC MID % 6.2 0 - 12 %M   POC Granulocyte 4.9 2 - 6.9   Granulocyte percent 79.1 37 - 80 %G   RBC 4.41 4.04 - 5.48 M/uL   Hemoglobin 12.4 12.2 - 16.2 g/dL   HCT, POC 37.0 (A) 37.7 - 47.9 %   MCV 83.9 80 - 97 fL   MCH, POC 28.1 27 - 31.2 pg   MCHC 33.5 31.8 - 35.4 g/dL   RDW, POC 12.3 %   Platelet Count, POC 282 142 - 424 K/uL   MPV 6.8 0 - 99.8 fL  EKG/XRAY:   Primary read interpreted by Dr. Everlene Farrier at [facility]. There are mild increased markings in the bases. The radiologist reviewed these films and he feels there is a pulmonary nodule   ASSESSMENT/PLAN: The patient does have mild increased markings in the bases. The radiologist felt she could have a lung nodule on the right. He thought  there was a nipple shadow on the left. He  recommended a CT. A CT was ordered. Will treat with a Z-Pak and Tessalon Perles. She is to force fluids.I personally performed the services described in this documentation, which was scribed in my presence. The recorded information has been reviewed and is accurate.   Gross sideeffects, risk and benefits, and alternatives of medications d/w patient. Patient is aware that all medications have potential sideeffects and we are unable to predict every sideeffect or drug-drug interaction that may occur.  Arlyss Queen MD 06/11/2015 11:28 AM

## 2015-06-12 NOTE — Telephone Encounter (Signed)
I had already spoken with patient regarding advice to go to the emergency room if any worsening shortness of breath or symptoms

## 2015-06-24 ENCOUNTER — Ambulatory Visit
Admission: RE | Admit: 2015-06-24 | Discharge: 2015-06-24 | Disposition: A | Discharge status: Home / Self Care | Payer: BLUE CROSS/BLUE SHIELD | Source: Ambulatory Visit | Attending: Emergency Medicine | Admitting: Emergency Medicine

## 2015-06-24 ENCOUNTER — Other Ambulatory Visit: Payer: Self-pay | Admitting: Emergency Medicine

## 2015-06-24 DIAGNOSIS — R918 Other nonspecific abnormal finding of lung field: Secondary | ICD-10-CM

## 2015-06-28 ENCOUNTER — Ambulatory Visit (INDEPENDENT_AMBULATORY_CARE_PROVIDER_SITE_OTHER): Payer: BLUE CROSS/BLUE SHIELD | Admitting: Internal Medicine

## 2015-06-28 ENCOUNTER — Other Ambulatory Visit (INDEPENDENT_AMBULATORY_CARE_PROVIDER_SITE_OTHER): Payer: BLUE CROSS/BLUE SHIELD

## 2015-06-28 ENCOUNTER — Encounter: Payer: Self-pay | Admitting: Internal Medicine

## 2015-06-28 VITALS — BP 140/94 | HR 75 | Ht 60.0 in | Wt 113.2 lb

## 2015-06-28 DIAGNOSIS — R918 Other nonspecific abnormal finding of lung field: Secondary | ICD-10-CM

## 2015-06-28 DIAGNOSIS — R059 Cough, unspecified: Secondary | ICD-10-CM

## 2015-06-28 DIAGNOSIS — R05 Cough: Secondary | ICD-10-CM

## 2015-06-28 LAB — URINALYSIS
Bilirubin Urine: NEGATIVE
Ketones, ur: NEGATIVE
LEUKOCYTES UA: NEGATIVE
NITRITE: NEGATIVE
PH: 6 (ref 5.0–8.0)
Total Protein, Urine: NEGATIVE
Urine Glucose: NEGATIVE
Urobilinogen, UA: 0.2 (ref 0.0–1.0)

## 2015-06-28 LAB — CBC WITH DIFFERENTIAL/PLATELET
BASOS PCT: 0.4 % (ref 0.0–3.0)
Basophils Absolute: 0 10*3/uL (ref 0.0–0.1)
EOS PCT: 1.1 % (ref 0.0–5.0)
Eosinophils Absolute: 0.1 10*3/uL (ref 0.0–0.7)
HEMATOCRIT: 36.8 % (ref 36.0–46.0)
HEMOGLOBIN: 12.5 g/dL (ref 12.0–15.0)
LYMPHS PCT: 27 % (ref 12.0–46.0)
Lymphs Abs: 1.3 10*3/uL (ref 0.7–4.0)
MCHC: 33.9 g/dL (ref 30.0–36.0)
MCV: 85.4 fl (ref 78.0–100.0)
MONO ABS: 0.4 10*3/uL (ref 0.1–1.0)
Monocytes Relative: 8.8 % (ref 3.0–12.0)
Neutro Abs: 3.1 10*3/uL (ref 1.4–7.7)
Neutrophils Relative %: 62.7 % (ref 43.0–77.0)
Platelets: 338 10*3/uL (ref 150.0–400.0)
RBC: 4.31 Mil/uL (ref 3.87–5.11)
RDW: 13.2 % (ref 11.5–15.5)
WBC: 4.9 10*3/uL (ref 4.0–10.5)

## 2015-06-28 LAB — HEPATIC FUNCTION PANEL
ALK PHOS: 71 U/L (ref 39–117)
ALT: 15 U/L (ref 0–35)
AST: 20 U/L (ref 0–37)
Albumin: 4.2 g/dL (ref 3.5–5.2)
BILIRUBIN DIRECT: 0.1 mg/dL (ref 0.0–0.3)
TOTAL PROTEIN: 7.7 g/dL (ref 6.0–8.3)
Total Bilirubin: 0.7 mg/dL (ref 0.2–1.2)

## 2015-06-28 LAB — BASIC METABOLIC PANEL
BUN: 11 mg/dL (ref 6–23)
CHLORIDE: 99 meq/L (ref 96–112)
CO2: 32 mEq/L (ref 19–32)
Calcium: 9.2 mg/dL (ref 8.4–10.5)
Creatinine, Ser: 0.76 mg/dL (ref 0.40–1.20)
GFR: 81.91 mL/min (ref >60.00)
Glucose, Bld: 89 mg/dL (ref 70–99)
POTASSIUM: 3.8 meq/L (ref 3.5–5.1)
Sodium: 137 mEq/L (ref 135–145)

## 2015-06-28 NOTE — Progress Notes (Signed)
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 Pulmonary Consult    Referred by Dr. Everlene Farrier for eval of pulmonary nodules. Pt c/o cough for the past 3 wks- prod with yellow to clear sputum.    hx is that at least once a year she gets a bad "cold" with coughing x at least a month that " doesn't respond to your western medicine, only my [place] then send me from home" then late August acute onset cough and sorethroat, never productive, assoc some sweats,no rigors, Cough was 24/7 and was productive but after zpak 06/11/15 mucus is clear and sleeps ok. Has not tried her [place] "I ran out"  Could never get an answer as to what it is or whether it really shortens her "one month pattern of cough"  Which she says has been the same pattern for decades, no particular time of the year, never with all that much mucus production and never  bloody sputum.  No obvious other patterns in day to day or daytime variabilty or assoc sob or cp or chest tightness, subjective wheeze overt sinus or hb symptoms. No unusual exp hx or h/o childhood pna/ asthma or knowledge of premature birth.  Sleeping ok without nocturnal  or early am exacerbation  of respiratory  c/o's or need for noct saba. Also denies any obvious fluctuation of symptoms with weather or environmental changes or other aggravating or alleviating factors except as outlined above   Current Medications, Allergies, Complete Past Medical History, Past Surgical History, Family History, and Social History were reviewed in [employer] record.             Review of Systems  Constitutional: Negative for  fever, chills and unexpected weight change.  HENT: Positive for sore throat. Negative for congestion, dental problem, ear pain, nosebleeds, postnasal drip, rhinorrhea, sinus pressure, sneezing, trouble swallowing and voice change.   Eyes: Negative for visual disturbance.  Respiratory: Positive for cough. Negative for choking and shortness of breath.   Cardiovascular: Negative for chest pain and leg swelling.  Gastrointestinal: Negative for vomiting, abdominal pain and diarrhea.  Genitourinary: Negative for difficulty urinating.  Musculoskeletal: Negative for arthralgias.  Skin: Negative for rash.  Neurological: Negative for tremors, syncope and headaches.  Hematological: Does not bruise/bleed easily.       Objective:   Physical Exam  amb chinese female nad unusual affect/ harsh throat clearing    Wt Readings from Last 3 Encounters:  06/28/15 113 lb 3.2 oz (51.347 kg)  06/11/15 109 lb 3.2 oz (49.533 kg)  09/27/14 113 lb (51.256 kg)    Vital signs reviewed     HEENT: nl dentition, turbinates, and orophanx. Nl external ear canals without cough reflex   NECK :  without JVD/Nodes/TM/ nl carotid upstrokes bilaterally   LUNGS: no acc muscle use, clear to A and P bilaterally without cough on insp or exp maneuvers   CV:  RRR  no s3 or murmur or increase in P2, no edema   ABD:  soft and nontender with nl excursion in the supine position. No bruits or organomegaly, bowel  sounds nl  MS:  warm without deformities, calf tenderness, cyanosis or clubbing  SKIN: warm and dry without lesions    NEURO:  alert, approp, no deficits    I personally reviewed images and agree with radiology impression as follows:  CT chest 06/24/15 1. Multiple bilateral pulmonary nodules, including solid nodules, semi-solid nodules and ground-glass nodules. Index lesions are measured above. 2. No evidence of metastatic lymphadenopathy in the chest or upper Abdomen.     Labs ordered/ reviewed:  Lab  06/28/15 1637  NA 137  K 3.8  CL 99  CO2 32  BUN 11  CREATININE 0.76  GLUCOSE 89     Lab 06/28/15 1637  HGB 12.5  HCT 36.8  WBC 4.9  PLT 338.0         Lab Results  Component Value Date   ESRSEDRATE 27* 06/28/2015       Other Labs ordered: quantiferon gold,   ana screen         Assessment & Plan:

## 2015-06-28 NOTE — Patient Instructions (Addendum)
Try prilosec otc '20mg'$   Take 30-60 min before first meal of the day and Pepcid ac (famotidine) 20 mg one @  bedtime until cough is completely gone for at least a week without the need for cough suppression  Please see patient coordinator before you leave today  to schedule PET scan   Please remember to go to the   x-ray department downstairs for your tests - we will call you with the results when they are available.     GERD (REFLUX)  is an extremely common cause of respiratory symptoms just like yours , many times with no obvious heartburn at all.    It can be treated with medication, but also with lifestyle changes including elevation of the head of your bed (ideally with 6 inch  bed blocks),  Smoking cessation, avoidance of late meals, excessive alcohol, and avoid fatty foods, chocolate, peppermint, colas, red wine, and acidic juices such as orange juice.  NO MINT OR MENTHOL PRODUCTS SO NO COUGH DROPS  USE SUGARLESS CANDY INSTEAD (Jolley ranchers or Stover's or Life Savers) or even ice chips will also do - the key is to swallow to prevent all throat clearing. NO OIL BASED VITAMINS - use powdered substitutes. Late add note pt listed as allergic to or intolerant to lansoprazole > will instruct to change to pepcid 20 mg bid if any adverse effects from prilosec

## 2015-06-29 ENCOUNTER — Encounter: Payer: Self-pay | Admitting: Internal Medicine

## 2015-06-29 ENCOUNTER — Telehealth: Payer: Self-pay | Admitting: *Deleted

## 2015-06-29 DIAGNOSIS — R059 Cough, unspecified: Secondary | ICD-10-CM | POA: Insufficient documentation

## 2015-06-29 DIAGNOSIS — R05 Cough: Secondary | ICD-10-CM | POA: Insufficient documentation

## 2015-06-29 LAB — SEDIMENTATION RATE: Sed Rate: 27 mm/hr — ABNORMAL HIGH (ref 0–22)

## 2015-06-29 LAB — ANCA SCREEN W REFLEX TITER: ANCA Screen: NEGATIVE

## 2015-06-29 NOTE — Assessment & Plan Note (Addendum)
-  1st detected by plain film  06/11/15 with no baseline avail - See CT Chest 06/24/15 - 06/28/2015  ESR 27 - 06/28/2015  Quant GOLD  - 06/28/2015  ANCA  - PET rec 06/28/2015    The differential diagnosis for multiple pulmonary nodules in this setting is myriad and includes unfortunately metastatic cancer, but also includes benign granulomatous processes and various forms of vasculitis. Nodular sarcoid would be very unusual in the absence of hilar adenopathy.  I submitted blood work for occult vasculitis and TB and will proceed with PET scan as the next logical step in the workup.  Discussed in detail all the  indications, usual  risks and alternatives  relative to the benefits with patient who agrees to proceed with conservative w/u as outlined and determine p PET when/ if /where to proceed with bx with the articular option just to follow this in 3 months with a repeat CT scan.

## 2015-06-29 NOTE — Telephone Encounter (Signed)
LMTCB

## 2015-06-29 NOTE — Assessment & Plan Note (Addendum)
The recurrent cough pattern dating back years sounds like an upper airway cough syndrome. It may have nothing to do nodules at all.  Of the three most common causes of chronic cough, only one (GERD)  can actually cause the other two (asthma and post nasal drip syndrome)  and perpetuate the cylce of cough inducing airway trauma, inflammation, heightened sensitivity to reflux which is prompted by the cough itself via a cyclical mechanism.    This may partially respond to steroids and look like asthma and post nasal drainage but never erradicated completely unless the cough and the secondary reflux are eliminated, preferably both at the same time.  While not intuitively obvious, many patients with chronic low grade reflux do not cough until there is a secondary insult that disturbs the protective epithelial barrier and exposes sensitive nerve endings.  This can be viral or direct physical injury such as with an endotracheal tube.   The point is that once this occurs, it is difficult to eliminate using anything but a maximally effective acid suppression regimen at least in the short run, accompanied by an appropriate diet to address non acid GERD.   I had an extended discussion with the patient reviewing all relevant studies completed to date and  lasting 35 min    Each maintenance medication was reviewed in detail including most importantly the difference between maintenance and prns and under what circumstances the prns are to be triggered using an action plan format that is not reflected in the computer generated alphabetically organized AVS.    Please see instructions for details which were reviewed in writing and the patient given a copy highlighting the part that I personally wrote and discussed at today's ov.

## 2015-06-29 NOTE — Telephone Encounter (Signed)
-----   Message from Tanda Rockers, MD sent at 06/29/2015 11:18 AM EDT ----- pt listed as allergic to or intolerant to lansoprazole >   instruct to change to pepcid 20 mg bid if any adverse effects from prilosec

## 2015-07-06 ENCOUNTER — Ambulatory Visit (HOSPITAL_COMMUNITY): Payer: BLUE CROSS/BLUE SHIELD

## 2016-08-03 ENCOUNTER — Encounter (HOSPITAL_COMMUNITY): Payer: Self-pay | Admitting: Emergency Medicine

## 2016-08-03 ENCOUNTER — Ambulatory Visit (INDEPENDENT_AMBULATORY_CARE_PROVIDER_SITE_OTHER): Payer: BLUE CROSS/BLUE SHIELD

## 2016-08-03 ENCOUNTER — Ambulatory Visit (HOSPITAL_COMMUNITY)
Admission: EM | Admit: 2016-08-03 | Discharge: 2016-08-03 | Disposition: A | Discharge status: Home / Self Care | Payer: BLUE CROSS/BLUE SHIELD | Attending: Internal Medicine | Admitting: Internal Medicine

## 2016-08-03 DIAGNOSIS — S60414A Abrasion of right ring finger, initial encounter: Secondary | ICD-10-CM

## 2016-08-03 DIAGNOSIS — S60412A Abrasion of right middle finger, initial encounter: Secondary | ICD-10-CM

## 2016-08-03 DIAGNOSIS — S5001XA Contusion of right elbow, initial encounter: Secondary | ICD-10-CM

## 2016-08-03 DIAGNOSIS — T148XXA Other injury of unspecified body region, initial encounter: Secondary | ICD-10-CM

## 2016-08-03 DIAGNOSIS — S60416A Abrasion of right little finger, initial encounter: Secondary | ICD-10-CM | POA: Diagnosis not present

## 2016-08-03 MED ORDER — CEPHALEXIN 250 MG PO CAPS: 250.0000 mg | ORAL_CAPSULE | Freq: Four times a day (QID) | ORAL | 0 refills | Status: AC

## 2016-08-03 NOTE — ED Provider Notes (Signed)
CSN: [id number]     Arrival date & time 08/03/16  1912 History   First MD Initiated Contact with Patient 08/03/16 2013     Chief Complaint  Patient presents with  . Fall   (Consider location/radiation/quality/duration/timing/severity/associated sxs/prior Treatment) HPI NP [address] [place]. NO PAIN MEDS AT HOME. WORRIED ABOUT TETANUS, IF SHE NEEDS ANTIBIOTICS, AND RIGHT ELBOW AND HAND PAIN.  Past Medical History:  Diagnosis Date  . Dental crown present   . History of herpes zoster   . Hypertension   . Wears glasses    Past Surgical History:  Procedure Laterality Date  . ABDOMINAL HYSTERECTOMY  09/27/2014   Procedure: HYSTERECTOMY ABDOMINAL;  Surgeon: Maeola Sarah. Landry Mellow, MD;  Location: [facility];  Service: Gynecology;;  . DIRECT LARYNGOSCOPY  2011   bx  . ENDOMETRIAL ABLATION    . FLEXIBLE SIGMOIDOSCOPY  09/27/2014   Procedure: FLEXIBLE SIGMOIDOSCOPY;  Surgeon: Maeola Sarah. Landry Mellow, MD;  Location: [facility];  Service: Gynecology;;  . ORIF WRIST FRACTURE Right 02/09/2014   Procedure: OPEN REDUCTION INTERNAL FIXATION (ORIF)/ ACUTRAK SCREW FIXATION RIGHT SCAPHOID;  Surgeon: Cammie Sickle., MD;  Location: [facility];  Service: Orthopedics;  Laterality: Right;  ANESTHESIA:  GENERAL/REGIONAL BLOCK  . ROBOTIC ASSISTED TOTAL HYSTERECTOMY WITH BILATERAL SALPINGO OOPHERECTOMY Bilateral 09/27/2014   Procedure: ROBOTIC ASSISTED TOTAL HYSTERECTOMY WITH BILATERAL SALPINGO OOPHORECTOMY;  Surgeon: Maeola Sarah. Landry Mellow, MD;  Location: [facility];  Service: Gynecology;  Laterality: Bilateral;  Attempted Robotic Procedure. Converted to open Case  . SALPINGOOPHORECTOMY Bilateral 09/27/2014   Procedure: SALPINGO OOPHORECTOMY;  Surgeon: Maeola Sarah. Landry Mellow, MD;  Location: [facility];  Service: Gynecology;  Laterality: Bilateral;  . VULVA SURGERY     cyst   Family History  Problem Relation Age of Onset  . Breast cancer Sister   . Heart disease Sister   . Allergies Brother    Social History   Substance Use Topics  . Smoking status: Never Smoker  . Smokeless tobacco: Never Used  . Alcohol use No   OB History    No data available     Review of Systems  Denies: HEADACHE, NAUSEA, ABDOMINAL PAIN, CHEST PAIN, CONGESTION, DYSURIA, SHORTNESS OF BREATH  Allergies  Codeine; Lansoprazole; Molds & smuts; Morphine and related; Other; and Watermelon flavor  Home Medications   Prior to Admission medications   Medication Sig Start Date End Date Taking? Authorizing Provider  acyclovir (ZOVIRAX) 400 MG tablet Take 400 mg by mouth 5 (five) times daily. As needed for cold sores    Historical Provider, MD  Alum & Mag Hydroxide-Simeth (MAGIC MOUTHWASH) SOLN Take 5 mLs by mouth 4 (four) times daily as needed for mouth pain.    Historical Provider, MD  b complex vitamins tablet Take 1 tablet by mouth daily. Takes as a packet.    Historical Provider, MD  benzonatate (TESSALON) 100 MG capsule Take 1-2 capsules (100-200 mg total) by mouth 3 (three) times daily as needed for cough. 06/11/15   Darlyne Russian, MD  HYDROmorphone (DILAUDID) 2 MG tablet Take 1-2 tablets (2-4 mg total) by mouth every 4 (four) hours as needed for severe pain. 09/29/14   Christophe Louis, MD  ibuprofen (ADVIL,MOTRIN) 600 MG tablet Take 1 tablet (600 mg total) by mouth every 6 (six) hours as needed (mild pain). 09/29/14   Christophe Louis, MD  Multiple Vitamin (MULTIVITAMIN WITH MINERALS) TABS tablet Take 1 tablet by mouth daily. Takes as a packet.    Historical Provider,  MD  NON FORMULARY Take 1 packet by mouth daily. Vit Packet ( Vit B, Calcium, Vit D)    Historical Provider, MD  triamcinolone (KENALOG) 0.1 % paste Use as directed 1 application in the mouth or throat 2 (two) times daily.    Historical Provider, MD   Meds Ordered and Administered this Visit  Medications - No data to display  BP 148/94   Pulse 76   Temp 98.6 F (37 C) (Oral)   Resp 16   Ht 5' (1.524 m)   Wt 118 lb (53.5 kg)   SpO2 97%   BMI 23.05 kg/m  No data  found.   Physical Exam NURSES NOTES AND VITAL SIGNS REVIEWED. CONSTITUTIONAL: Well developed, well nourished, no acute distress HEENT: normocephalic, atraumatic EYES: Conjunctiva normal NECK:normal ROM, supple, no adenopathy PULMONARY:No respiratory distress, normal effort ABDOMINAL: Soft, ND, NT BS+, No CVAT MUSCULOSKELETAL: Normal ROM of all extremities, RIGHT HAND 3 ABRASIONS LONG,RING,PINKY, MILD TENDERNESS TO RIGHT ELBOW, NO BRUISE.  SKIN: warm and dry without rash PSYCHIATRIC: Mood and affect, behavior are normal  Urgent Care Course   Clinical Course    Procedures (including critical care time)  Labs Review Labs Reviewed - No data to display  Imaging Review Dg Elbow Complete Right  Result Date: 08/03/2016 CLINICAL DATA:  [place] in front of the [place] going up steps and injured right elbow. Medial pain. EXAM: RIGHT ELBOW - COMPLETE 3+ VIEW COMPARISON:  None. FINDINGS: There is no evidence of fracture, dislocation, or joint effusion. There is no evidence of arthropathy or other focal bone abnormality. Soft tissues are unremarkable. IMPRESSION: Negative. Electronically Signed   By: Misty Stanley M.D.   On: 08/03/2016 20:38   Dg Hand Complete Right  Result Date: 08/03/2016 CLINICAL DATA:  Status post fall onto outstretched hand. EXAM: RIGHT HAND - COMPLETE 3+ VIEW COMPARISON:  None. FINDINGS: There is no evidence of fracture or dislocation. There is no evidence of arthropathy or other focal bone abnormality. Soft tissues are unremarkable. There is a fully threaded screw at the right scaphoid without abnormal lucency. IMPRESSION: No acute fracture of the right hand. Electronically Signed   By: Ulyses Jarred M.D.   On: 08/03/2016 20:41     Visual Acuity Review  Right Eye Distance:   Left Eye Distance:   Bilateral Distance:    Right Eye Near:   Left Eye Near:    Bilateral Near:         MDM   1. Abrasion   2. Contusion of right elbow, initial encounter   RIGHT  HAND   Patient is reassured that there are no issues that require transfer to higher level of care at this time or additional tests. Patient is advised to continue home symptomatic treatment. Patient is advised that if there are new or worsening symptoms to attend the emergency department, contact primary care provider, or return to UC. Instructions of care provided discharged home in stable condition.    THIS NOTE WAS GENERATED USING A VOICE RECOGNITION SOFTWARE PROGRAM. ALL REASONABLE EFFORTS  WERE MADE TO PROOFREAD THIS DOCUMENT FOR ACCURACY.  I have verbally reviewed the discharge instructions with the patient. A printed AVS was given to the patient.  All questions were answered prior to discharge.      Konrad Felix, PA 08/03/16 [id number]

## 2016-08-03 NOTE — ED Triage Notes (Signed)
Pt fell up one stair entering [employer]. PT then worked a shift and came here to be evaluated.

## 2016-08-03 NOTE — ED Notes (Signed)
D/c by Frank Patrick, PA  

## 2016-08-03 NOTE — ED Triage Notes (Signed)
Pt has an abrasion on 3,4,5th fingers on right hand. Abrasion on right elbow, and abrasion on right knee. PT is worried she made need antibiotics. PT thinks she had a tetanus shot last year.

## 2017-05-24 IMAGING — DX DG ELBOW COMPLETE 3+V*R*
4 series · 4 of 4 positions shown · non-contrast
Comparison: None.

CLINICAL DATA: [place] front of the [place] going [place] and
injured right elbow. Medial pain.

EXAM:
RIGHT ELBOW - COMPLETE 3+ VIEW

[elbow ap]
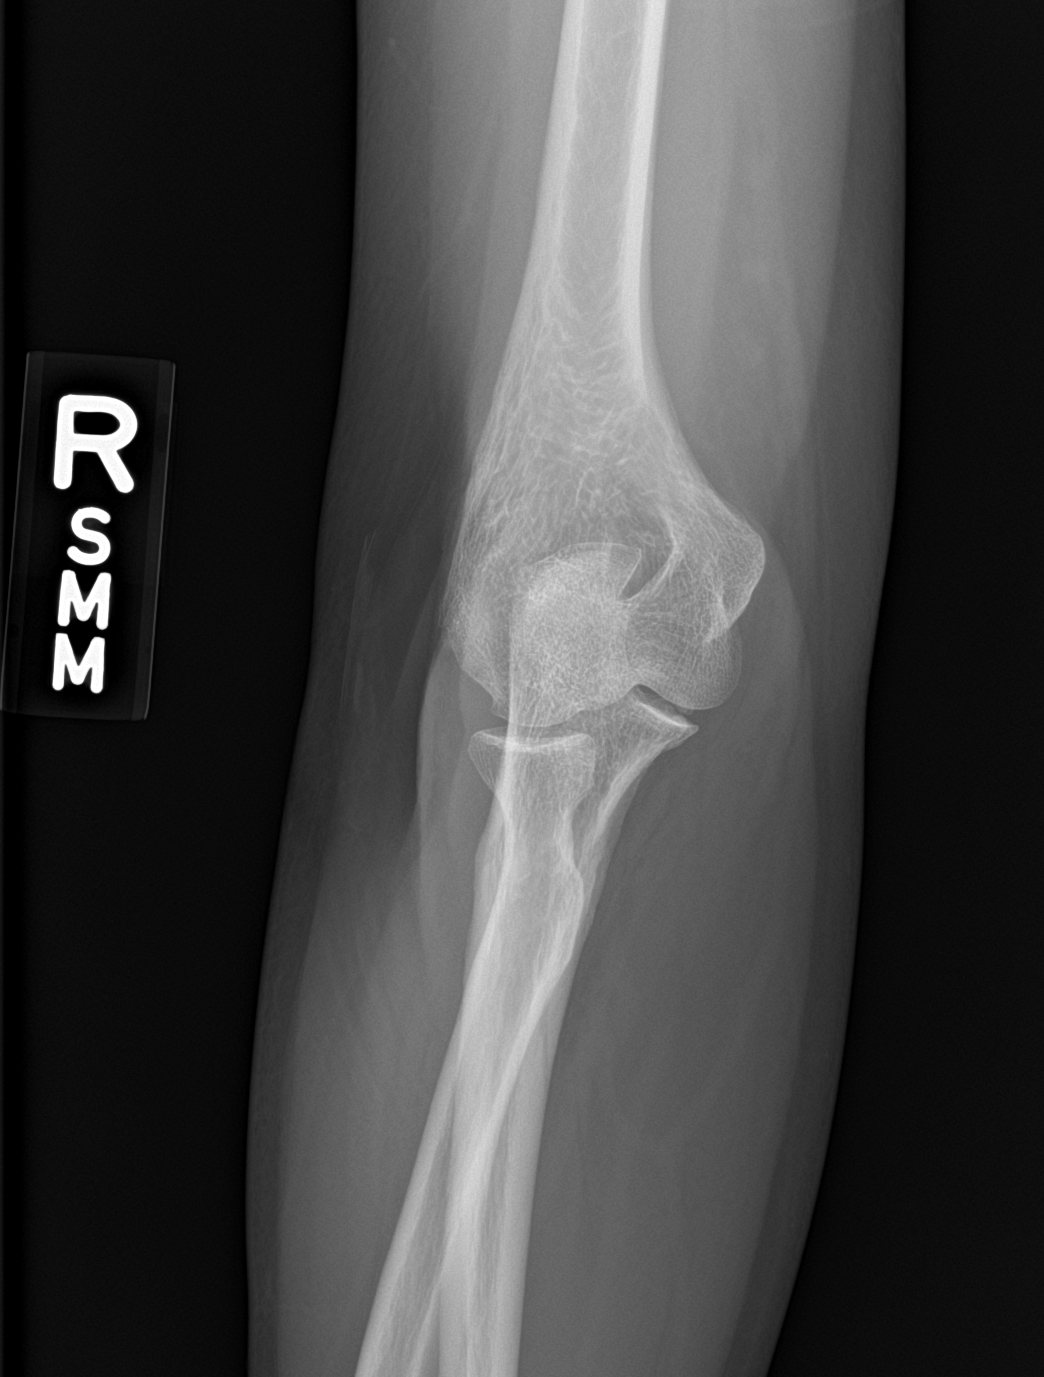

[elbow obl (1 of 2)]
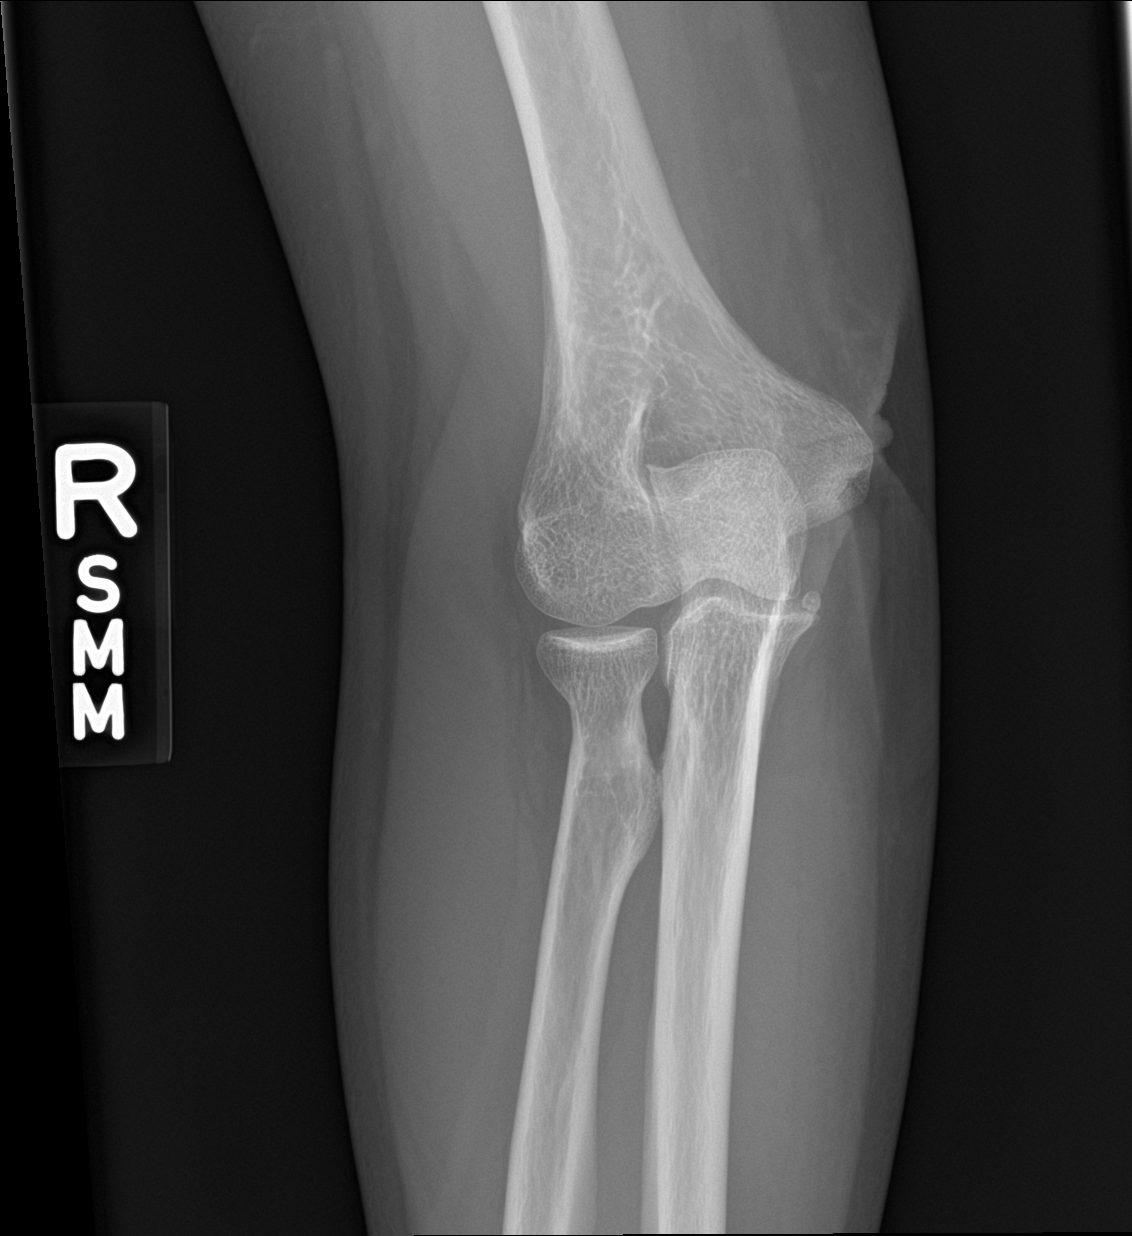

[elbow obl (2 of 2)]
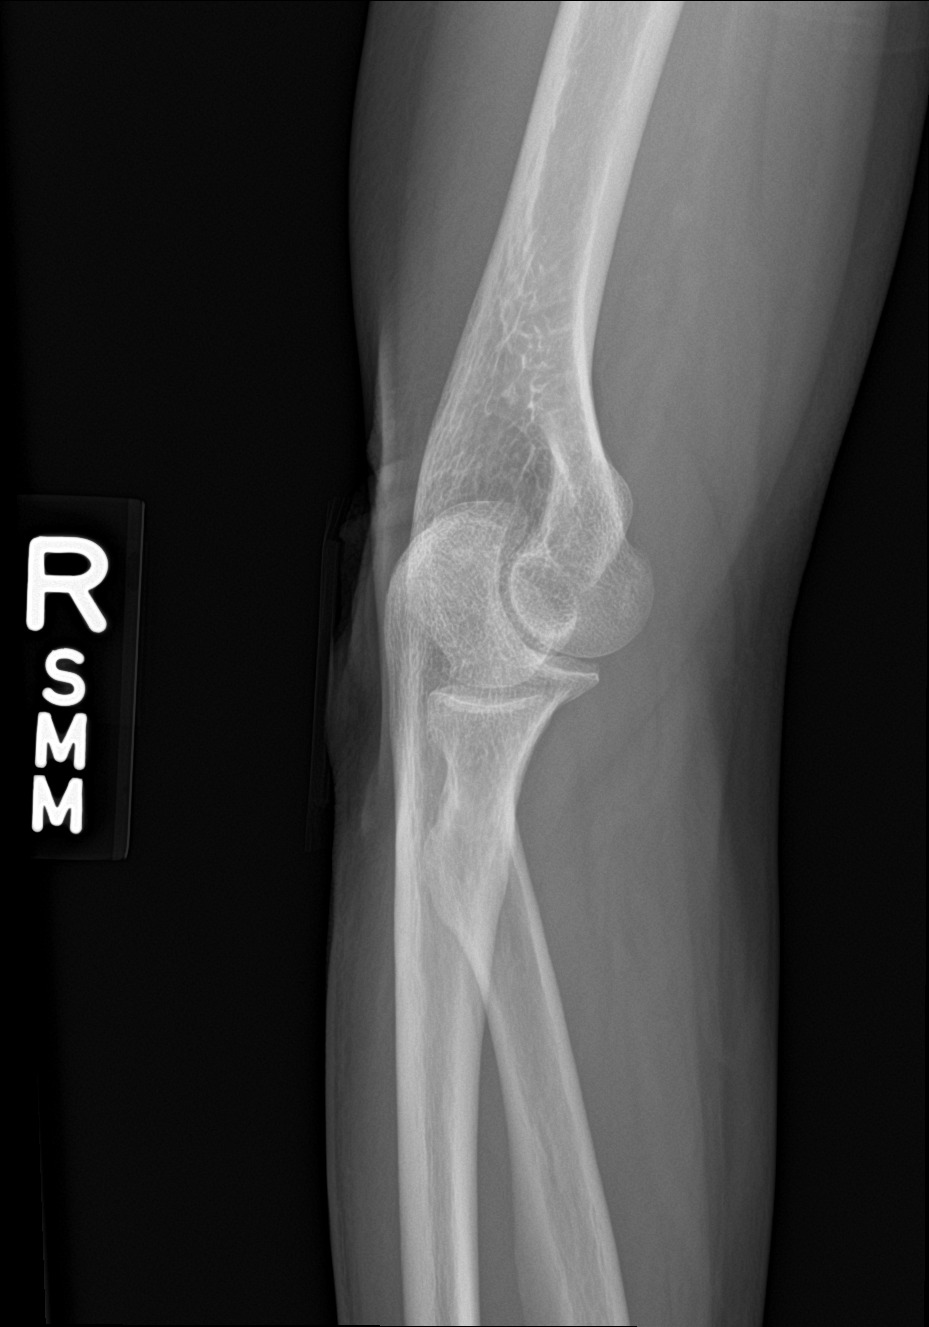

[elbow lat]
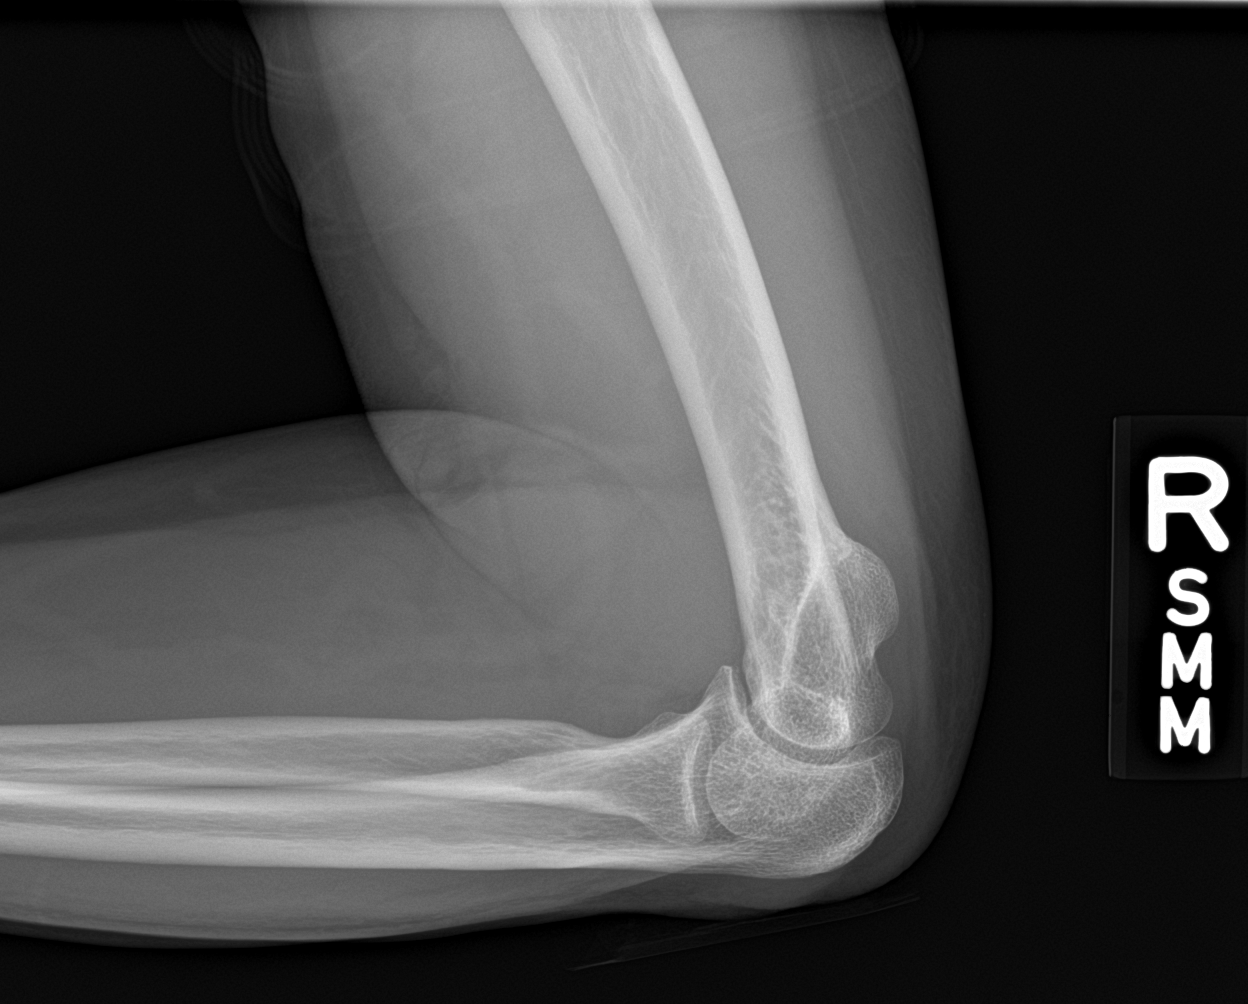

[4 of 4 positions shown; findings below may reference images not displayed]

FINDINGS: There is no evidence of fracture, dislocation, or joint effusion.
There is no evidence of arthropathy or other focal bone abnormality.
Soft tissues are unremarkable.
IMPRESSION: Negative.

## 2017-08-02 ENCOUNTER — Encounter (HOSPITAL_COMMUNITY): Payer: Self-pay | Admitting: *Deleted

## 2017-08-02 ENCOUNTER — Emergency Department (HOSPITAL_COMMUNITY)
Admission: EM | Admit: 2017-08-02 | Discharge: 2017-08-02 | Disposition: A | Discharge status: Home / Self Care | Payer: BLUE CROSS/BLUE SHIELD | Attending: Emergency Medicine | Admitting: Emergency Medicine

## 2017-08-02 DIAGNOSIS — S161XXA Strain of muscle, fascia and tendon at neck level, initial encounter: Secondary | ICD-10-CM | POA: Diagnosis not present

## 2017-08-02 DIAGNOSIS — Z79899 Other long term (current) drug therapy: Secondary | ICD-10-CM | POA: Diagnosis not present

## 2017-08-02 DIAGNOSIS — Y999 Unspecified external cause status: Secondary | ICD-10-CM | POA: Insufficient documentation

## 2017-08-02 DIAGNOSIS — Y92410 Unspecified street and highway as the place of occurrence of the external cause: Secondary | ICD-10-CM | POA: Diagnosis not present

## 2017-08-02 DIAGNOSIS — S199XXA Unspecified injury of neck, initial encounter: Secondary | ICD-10-CM | POA: Diagnosis present

## 2017-08-02 DIAGNOSIS — Y9389 Activity, other specified: Secondary | ICD-10-CM | POA: Diagnosis not present

## 2017-08-02 MED ORDER — METHOCARBAMOL 500 MG PO TABS: 500.0000 mg | ORAL_TABLET | Freq: Two times a day (BID) | ORAL | 0 refills | Status: AC

## 2017-08-02 MED ORDER — IBUPROFEN 600 MG PO TABS: 600.0000 mg | ORAL_TABLET | Freq: Four times a day (QID) | ORAL | 0 refills | Status: AC | PRN

## 2017-08-02 MED ORDER — LIDOCAINE 5 % EX PTCH: 1.0000 | MEDICATED_PATCH | CUTANEOUS | 0 refills | Status: AC
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
## 2017-08-02 NOTE — Discharge Instructions (Signed)
Expect your soreness to increase over the next 2-3 days. Take it easy, but do not lay around too much as this may make any stiffness worse.  Antiinflammatory medications: Take 600 mg of ibuprofen every 6 hours or 440 mg (over the counter dose) to 500 mg (prescription dose) of naproxen every 12 hours for the next 3 days. After this time, these medications may be used as needed for pain. Take these medications with food to avoid upset stomach. Choose only one of these medications, do not take them together.  Tylenol: Should you continue to have additional pain while taking the ibuprofen or naproxen, you may add in tylenol as needed. Your daily total maximum amount of tylenol from all sources should be limited to 4000mg /day for persons without liver problems, or 2000mg /day for those with liver problems. Muscle relaxer: Robaxin is a muscle relaxer and may help loosen stiff muscles. Do not take the Robaxin while driving or performing other dangerous activities.  Lidocaine patches: These are available via either prescription or over-the-counter. The over-the-counter option may be more economical one and are likely just as effective. There are multiple over-the-counter brands, such as Salonpas. Exercises: Be sure to perform the attached exercises starting with three times a week and working up to performing them daily. This is an essential part of preventing long term problems.   Follow up with a primary care provider for any future management of these complaints.

## 2020-02-02 DIAGNOSIS — R918 Other nonspecific abnormal finding of lung field: Secondary | ICD-10-CM | POA: Diagnosis not present

## 2020-02-02 DIAGNOSIS — Z90710 Acquired absence of both cervix and uterus: Secondary | ICD-10-CM | POA: Diagnosis not present

## 2020-02-02 DIAGNOSIS — J302 Other seasonal allergic rhinitis: Secondary | ICD-10-CM | POA: Diagnosis not present

## 2020-02-02 DIAGNOSIS — Z5320 Procedure and treatment not carried out because of patient's decision for unspecified reasons: Secondary | ICD-10-CM | POA: Diagnosis not present

## 2020-02-02 DIAGNOSIS — Z0189 Encounter for other specified special examinations: Secondary | ICD-10-CM | POA: Diagnosis not present

## 2020-02-02 DIAGNOSIS — Z1211 Encounter for screening for malignant neoplasm of colon: Secondary | ICD-10-CM | POA: Diagnosis not present

## 2020-02-02 DIAGNOSIS — I10 Essential (primary) hypertension: Secondary | ICD-10-CM | POA: Diagnosis not present

## 2020-02-02 DIAGNOSIS — Z13820 Encounter for screening for osteoporosis: Secondary | ICD-10-CM | POA: Diagnosis not present

## 2020-02-02 DIAGNOSIS — K120 Recurrent oral aphthae: Secondary | ICD-10-CM | POA: Diagnosis not present

## 2020-02-02 DIAGNOSIS — Z803 Family history of malignant neoplasm of breast: Secondary | ICD-10-CM | POA: Diagnosis not present

## 2020-02-03 DIAGNOSIS — I10 Essential (primary) hypertension: Secondary | ICD-10-CM | POA: Diagnosis not present

## 2020-02-03 DIAGNOSIS — Z13220 Encounter for screening for lipoid disorders: Secondary | ICD-10-CM | POA: Diagnosis not present

## 2020-02-03 DIAGNOSIS — Z1329 Encounter for screening for other suspected endocrine disorder: Secondary | ICD-10-CM | POA: Diagnosis not present

## 2020-02-09 DIAGNOSIS — R918 Other nonspecific abnormal finding of lung field: Secondary | ICD-10-CM | POA: Diagnosis not present

## 2020-02-09 DIAGNOSIS — Q278 Other specified congenital malformations of peripheral vascular system: Secondary | ICD-10-CM | POA: Diagnosis not present

## 2020-02-09 DIAGNOSIS — M810 Age-related osteoporosis without current pathological fracture: Secondary | ICD-10-CM | POA: Diagnosis not present

## 2020-02-09 DIAGNOSIS — I719 Aortic aneurysm of unspecified site, without rupture: Secondary | ICD-10-CM | POA: Diagnosis not present

## 2020-02-09 DIAGNOSIS — Z780 Asymptomatic menopausal state: Secondary | ICD-10-CM | POA: Diagnosis not present

## 2020-02-11 DIAGNOSIS — R918 Other nonspecific abnormal finding of lung field: Secondary | ICD-10-CM | POA: Diagnosis not present

## 2020-02-11 DIAGNOSIS — I700 Atherosclerosis of aorta: Secondary | ICD-10-CM | POA: Diagnosis not present

## 2020-02-11 DIAGNOSIS — I712 Thoracic aortic aneurysm, without rupture: Secondary | ICD-10-CM | POA: Diagnosis not present

## 2020-02-11 DIAGNOSIS — K8020 Calculus of gallbladder without cholecystitis without obstruction: Secondary | ICD-10-CM | POA: Diagnosis not present

## 2020-02-15 DIAGNOSIS — M810 Age-related osteoporosis without current pathological fracture: Secondary | ICD-10-CM | POA: Diagnosis not present

## 2020-02-15 DIAGNOSIS — H538 Other visual disturbances: Secondary | ICD-10-CM | POA: Diagnosis not present

## 2020-02-15 DIAGNOSIS — J302 Other seasonal allergic rhinitis: Secondary | ICD-10-CM | POA: Diagnosis not present

## 2020-02-15 DIAGNOSIS — I10 Essential (primary) hypertension: Secondary | ICD-10-CM | POA: Diagnosis not present

## 2020-02-15 DIAGNOSIS — H6991 Unspecified Eustachian tube disorder, right ear: Secondary | ICD-10-CM | POA: Diagnosis not present

## 2020-02-15 DIAGNOSIS — H6123 Impacted cerumen, bilateral: Secondary | ICD-10-CM | POA: Diagnosis not present

## 2020-02-15 DIAGNOSIS — I712 Thoracic aortic aneurysm, without rupture: Secondary | ICD-10-CM | POA: Diagnosis not present

## 2020-02-15 DIAGNOSIS — R918 Other nonspecific abnormal finding of lung field: Secondary | ICD-10-CM | POA: Diagnosis not present

## 2020-02-18 DIAGNOSIS — R918 Other nonspecific abnormal finding of lung field: Secondary | ICD-10-CM | POA: Diagnosis not present

## 2020-02-18 DIAGNOSIS — I712 Thoracic aortic aneurysm, without rupture: Secondary | ICD-10-CM | POA: Diagnosis not present

## 2020-02-19 DIAGNOSIS — I351 Nonrheumatic aortic (valve) insufficiency: Secondary | ICD-10-CM | POA: Diagnosis not present

## 2020-02-19 DIAGNOSIS — R918 Other nonspecific abnormal finding of lung field: Secondary | ICD-10-CM | POA: Diagnosis not present

## 2020-02-19 DIAGNOSIS — I712 Thoracic aortic aneurysm, without rupture: Secondary | ICD-10-CM | POA: Diagnosis not present

## 2020-02-19 DIAGNOSIS — I340 Nonrheumatic mitral (valve) insufficiency: Secondary | ICD-10-CM | POA: Diagnosis not present

## 2020-02-19 DIAGNOSIS — I358 Other nonrheumatic aortic valve disorders: Secondary | ICD-10-CM | POA: Diagnosis not present

## 2020-02-22 DIAGNOSIS — R69 Illness, unspecified: Secondary | ICD-10-CM | POA: Diagnosis not present

## 2020-02-24 DIAGNOSIS — H02831 Dermatochalasis of right upper eyelid: Secondary | ICD-10-CM | POA: Diagnosis not present

## 2020-02-24 DIAGNOSIS — H02834 Dermatochalasis of left upper eyelid: Secondary | ICD-10-CM | POA: Diagnosis not present

## 2020-02-24 DIAGNOSIS — H25043 Posterior subcapsular polar age-related cataract, bilateral: Secondary | ICD-10-CM | POA: Diagnosis not present

## 2020-02-24 DIAGNOSIS — H11153 Pinguecula, bilateral: Secondary | ICD-10-CM | POA: Diagnosis not present

## 2020-02-24 DIAGNOSIS — H524 Presbyopia: Secondary | ICD-10-CM | POA: Diagnosis not present

## 2020-02-24 DIAGNOSIS — H2513 Age-related nuclear cataract, bilateral: Secondary | ICD-10-CM | POA: Diagnosis not present

## 2020-02-24 DIAGNOSIS — H52209 Unspecified astigmatism, unspecified eye: Secondary | ICD-10-CM | POA: Diagnosis not present

## 2020-02-24 DIAGNOSIS — H5213 Myopia, bilateral: Secondary | ICD-10-CM | POA: Diagnosis not present

## 2020-02-24 DIAGNOSIS — H18413 Arcus senilis, bilateral: Secondary | ICD-10-CM | POA: Diagnosis not present

## 2020-02-24 DIAGNOSIS — H52203 Unspecified astigmatism, bilateral: Secondary | ICD-10-CM | POA: Diagnosis not present

## 2020-02-29 DIAGNOSIS — R918 Other nonspecific abnormal finding of lung field: Secondary | ICD-10-CM | POA: Diagnosis not present

## 2020-03-08 DIAGNOSIS — C3412 Malignant neoplasm of upper lobe, left bronchus or lung: Secondary | ICD-10-CM | POA: Diagnosis not present

## 2020-03-08 DIAGNOSIS — R918 Other nonspecific abnormal finding of lung field: Secondary | ICD-10-CM | POA: Diagnosis not present

## 2020-03-08 DIAGNOSIS — R911 Solitary pulmonary nodule: Secondary | ICD-10-CM | POA: Diagnosis not present

## 2020-03-08 DIAGNOSIS — J95811 Postprocedural pneumothorax: Secondary | ICD-10-CM | POA: Diagnosis not present

## 2020-03-09 DIAGNOSIS — R918 Other nonspecific abnormal finding of lung field: Secondary | ICD-10-CM | POA: Diagnosis not present

## 2020-03-31 DIAGNOSIS — C3412 Malignant neoplasm of upper lobe, left bronchus or lung: Secondary | ICD-10-CM | POA: Diagnosis not present

## 2020-03-31 DIAGNOSIS — I712 Thoracic aortic aneurysm, without rupture: Secondary | ICD-10-CM | POA: Diagnosis not present

## 2020-03-31 DIAGNOSIS — C3492 Malignant neoplasm of unspecified part of left bronchus or lung: Secondary | ICD-10-CM | POA: Diagnosis not present

## 2020-04-06 DIAGNOSIS — I712 Thoracic aortic aneurysm, without rupture: Secondary | ICD-10-CM | POA: Diagnosis not present

## 2020-04-06 DIAGNOSIS — E782 Mixed hyperlipidemia: Secondary | ICD-10-CM | POA: Diagnosis not present

## 2020-04-06 DIAGNOSIS — C3492 Malignant neoplasm of unspecified part of left bronchus or lung: Secondary | ICD-10-CM | POA: Diagnosis not present

## 2020-04-06 DIAGNOSIS — I10 Essential (primary) hypertension: Secondary | ICD-10-CM | POA: Diagnosis not present

## 2020-04-15 DIAGNOSIS — C3492 Malignant neoplasm of unspecified part of left bronchus or lung: Secondary | ICD-10-CM | POA: Diagnosis not present

## 2020-04-15 DIAGNOSIS — I351 Nonrheumatic aortic (valve) insufficiency: Secondary | ICD-10-CM | POA: Diagnosis not present

## 2020-04-15 DIAGNOSIS — E782 Mixed hyperlipidemia: Secondary | ICD-10-CM | POA: Diagnosis not present

## 2020-04-15 DIAGNOSIS — R9431 Abnormal electrocardiogram [ECG] [EKG]: Secondary | ICD-10-CM | POA: Diagnosis not present

## 2020-04-15 DIAGNOSIS — I712 Thoracic aortic aneurysm, without rupture: Secondary | ICD-10-CM | POA: Diagnosis not present

## 2020-04-15 DIAGNOSIS — I10 Essential (primary) hypertension: Secondary | ICD-10-CM | POA: Diagnosis not present

## 2020-04-15 DIAGNOSIS — I2510 Atherosclerotic heart disease of native coronary artery without angina pectoris: Secondary | ICD-10-CM | POA: Diagnosis not present

## 2020-04-16 DIAGNOSIS — C3492 Malignant neoplasm of unspecified part of left bronchus or lung: Secondary | ICD-10-CM | POA: Diagnosis not present

## 2020-04-16 DIAGNOSIS — E785 Hyperlipidemia, unspecified: Secondary | ICD-10-CM | POA: Diagnosis not present

## 2020-04-16 DIAGNOSIS — I712 Thoracic aortic aneurysm, without rupture: Secondary | ICD-10-CM | POA: Diagnosis not present

## 2020-04-16 DIAGNOSIS — I10 Essential (primary) hypertension: Secondary | ICD-10-CM | POA: Diagnosis not present

## 2020-04-19 DIAGNOSIS — C3492 Malignant neoplasm of unspecified part of left bronchus or lung: Secondary | ICD-10-CM | POA: Diagnosis not present

## 2020-05-03 DIAGNOSIS — C3492 Malignant neoplasm of unspecified part of left bronchus or lung: Secondary | ICD-10-CM | POA: Diagnosis not present

## 2020-05-03 DIAGNOSIS — K120 Recurrent oral aphthae: Secondary | ICD-10-CM | POA: Diagnosis not present

## 2020-05-03 DIAGNOSIS — E782 Mixed hyperlipidemia: Secondary | ICD-10-CM | POA: Diagnosis not present

## 2020-05-03 DIAGNOSIS — I712 Thoracic aortic aneurysm, without rupture: Secondary | ICD-10-CM | POA: Diagnosis not present

## 2020-05-03 DIAGNOSIS — I10 Essential (primary) hypertension: Secondary | ICD-10-CM | POA: Diagnosis not present

## 2020-05-03 DIAGNOSIS — Z23 Encounter for immunization: Secondary | ICD-10-CM | POA: Diagnosis not present

## 2020-05-11 DIAGNOSIS — I712 Thoracic aortic aneurysm, without rupture: Secondary | ICD-10-CM | POA: Diagnosis not present

## 2020-05-11 DIAGNOSIS — K120 Recurrent oral aphthae: Secondary | ICD-10-CM | POA: Diagnosis not present

## 2020-05-11 DIAGNOSIS — R918 Other nonspecific abnormal finding of lung field: Secondary | ICD-10-CM | POA: Diagnosis not present

## 2020-05-11 DIAGNOSIS — C3412 Malignant neoplasm of upper lobe, left bronchus or lung: Secondary | ICD-10-CM | POA: Diagnosis not present

## 2020-05-11 DIAGNOSIS — C3492 Malignant neoplasm of unspecified part of left bronchus or lung: Secondary | ICD-10-CM | POA: Diagnosis not present

## 2020-05-11 DIAGNOSIS — J939 Pneumothorax, unspecified: Secondary | ICD-10-CM | POA: Diagnosis not present

## 2020-05-11 DIAGNOSIS — Z4682 Encounter for fitting and adjustment of non-vascular catheter: Secondary | ICD-10-CM | POA: Diagnosis not present

## 2020-05-11 DIAGNOSIS — Y838 Other surgical procedures as the cause of abnormal reaction of the patient, or of later complication, without mention of misadventure at the time of the procedure: Secondary | ICD-10-CM | POA: Diagnosis not present

## 2020-05-11 DIAGNOSIS — J95812 Postprocedural air leak: Secondary | ICD-10-CM | POA: Diagnosis not present

## 2020-05-11 DIAGNOSIS — M810 Age-related osteoporosis without current pathological fracture: Secondary | ICD-10-CM | POA: Diagnosis not present

## 2020-05-11 DIAGNOSIS — J984 Other disorders of lung: Secondary | ICD-10-CM | POA: Diagnosis not present

## 2020-05-11 DIAGNOSIS — Z9889 Other specified postprocedural states: Secondary | ICD-10-CM | POA: Diagnosis not present

## 2020-05-11 DIAGNOSIS — J439 Emphysema, unspecified: Secondary | ICD-10-CM | POA: Diagnosis not present

## 2020-05-11 DIAGNOSIS — I700 Atherosclerosis of aorta: Secondary | ICD-10-CM | POA: Diagnosis not present

## 2020-05-11 DIAGNOSIS — Z85118 Personal history of other malignant neoplasm of bronchus and lung: Secondary | ICD-10-CM | POA: Diagnosis not present

## 2020-05-11 DIAGNOSIS — E782 Mixed hyperlipidemia: Secondary | ICD-10-CM | POA: Diagnosis not present

## 2020-05-11 DIAGNOSIS — J8410 Pulmonary fibrosis, unspecified: Secondary | ICD-10-CM | POA: Diagnosis not present

## 2020-05-11 DIAGNOSIS — G8912 Acute post-thoracotomy pain: Secondary | ICD-10-CM | POA: Diagnosis not present

## 2020-05-11 DIAGNOSIS — R599 Enlarged lymph nodes, unspecified: Secondary | ICD-10-CM | POA: Diagnosis not present

## 2020-05-11 DIAGNOSIS — J9811 Atelectasis: Secondary | ICD-10-CM | POA: Diagnosis not present

## 2020-05-11 DIAGNOSIS — K30 Functional dyspepsia: Secondary | ICD-10-CM | POA: Diagnosis not present

## 2020-05-11 DIAGNOSIS — I10 Essential (primary) hypertension: Secondary | ICD-10-CM | POA: Diagnosis not present

## 2020-05-19 DIAGNOSIS — Z7982 Long term (current) use of aspirin: Secondary | ICD-10-CM | POA: Diagnosis not present

## 2020-05-19 DIAGNOSIS — E785 Hyperlipidemia, unspecified: Secondary | ICD-10-CM | POA: Diagnosis not present

## 2020-05-19 DIAGNOSIS — I712 Thoracic aortic aneurysm, without rupture: Secondary | ICD-10-CM | POA: Diagnosis not present

## 2020-05-19 DIAGNOSIS — I2510 Atherosclerotic heart disease of native coronary artery without angina pectoris: Secondary | ICD-10-CM | POA: Diagnosis not present

## 2020-05-19 DIAGNOSIS — R05 Cough: Secondary | ICD-10-CM | POA: Diagnosis not present

## 2020-05-19 DIAGNOSIS — I10 Essential (primary) hypertension: Secondary | ICD-10-CM | POA: Diagnosis not present

## 2020-05-19 DIAGNOSIS — E782 Mixed hyperlipidemia: Secondary | ICD-10-CM | POA: Diagnosis not present

## 2020-05-19 DIAGNOSIS — Z79899 Other long term (current) drug therapy: Secondary | ICD-10-CM | POA: Diagnosis not present

## 2020-05-19 DIAGNOSIS — R911 Solitary pulmonary nodule: Secondary | ICD-10-CM | POA: Diagnosis not present

## 2020-05-19 DIAGNOSIS — Z902 Acquired absence of lung [part of]: Secondary | ICD-10-CM | POA: Diagnosis not present

## 2020-05-19 DIAGNOSIS — Z9889 Other specified postprocedural states: Secondary | ICD-10-CM | POA: Diagnosis not present

## 2020-06-09 DIAGNOSIS — C3412 Malignant neoplasm of upper lobe, left bronchus or lung: Secondary | ICD-10-CM | POA: Diagnosis not present

## 2020-06-09 DIAGNOSIS — I712 Thoracic aortic aneurysm, without rupture: Secondary | ICD-10-CM | POA: Diagnosis not present

## 2020-06-09 DIAGNOSIS — J9811 Atelectasis: Secondary | ICD-10-CM | POA: Diagnosis not present

## 2020-06-09 DIAGNOSIS — Z888 Allergy status to other drugs, medicaments and biological substances status: Secondary | ICD-10-CM | POA: Diagnosis not present

## 2020-06-09 DIAGNOSIS — Z885 Allergy status to narcotic agent status: Secondary | ICD-10-CM | POA: Diagnosis not present

## 2020-06-09 DIAGNOSIS — J90 Pleural effusion, not elsewhere classified: Secondary | ICD-10-CM | POA: Diagnosis not present

## 2020-06-09 DIAGNOSIS — R05 Cough: Secondary | ICD-10-CM | POA: Diagnosis not present

## 2020-06-09 DIAGNOSIS — Z9889 Other specified postprocedural states: Secondary | ICD-10-CM | POA: Diagnosis not present

## 2020-08-04 DIAGNOSIS — Z902 Acquired absence of lung [part of]: Secondary | ICD-10-CM | POA: Diagnosis not present

## 2020-08-04 DIAGNOSIS — C3412 Malignant neoplasm of upper lobe, left bronchus or lung: Secondary | ICD-10-CM | POA: Diagnosis not present

## 2020-08-04 DIAGNOSIS — I712 Thoracic aortic aneurysm, without rupture: Secondary | ICD-10-CM | POA: Diagnosis not present

## 2020-08-04 DIAGNOSIS — R059 Cough, unspecified: Secondary | ICD-10-CM | POA: Diagnosis not present

## 2020-09-13 DIAGNOSIS — C3492 Malignant neoplasm of unspecified part of left bronchus or lung: Secondary | ICD-10-CM | POA: Diagnosis not present

## 2020-09-13 DIAGNOSIS — C3490 Malignant neoplasm of unspecified part of unspecified bronchus or lung: Secondary | ICD-10-CM | POA: Diagnosis not present

## 2020-09-15 DIAGNOSIS — I712 Thoracic aortic aneurysm, without rupture: Secondary | ICD-10-CM | POA: Diagnosis not present

## 2020-09-15 DIAGNOSIS — C3412 Malignant neoplasm of upper lobe, left bronchus or lung: Secondary | ICD-10-CM | POA: Diagnosis not present

## 2020-09-15 DIAGNOSIS — Z902 Acquired absence of lung [part of]: Secondary | ICD-10-CM | POA: Diagnosis not present

## 2020-09-15 DIAGNOSIS — R911 Solitary pulmonary nodule: Secondary | ICD-10-CM | POA: Diagnosis not present

## 2020-10-05 DIAGNOSIS — I712 Thoracic aortic aneurysm, without rupture: Secondary | ICD-10-CM | POA: Diagnosis not present

## 2020-10-13 DIAGNOSIS — K219 Gastro-esophageal reflux disease without esophagitis: Secondary | ICD-10-CM | POA: Diagnosis not present

## 2020-10-13 DIAGNOSIS — R29710 NIHSS score 10: Secondary | ICD-10-CM | POA: Diagnosis not present

## 2020-10-13 DIAGNOSIS — D62 Acute posthemorrhagic anemia: Secondary | ICD-10-CM | POA: Diagnosis not present

## 2020-10-13 DIAGNOSIS — Z01818 Encounter for other preprocedural examination: Secondary | ICD-10-CM | POA: Diagnosis not present

## 2020-10-13 DIAGNOSIS — I129 Hypertensive chronic kidney disease with stage 1 through stage 4 chronic kidney disease, or unspecified chronic kidney disease: Secondary | ICD-10-CM | POA: Diagnosis not present

## 2020-10-13 DIAGNOSIS — I6389 Other cerebral infarction: Secondary | ICD-10-CM | POA: Diagnosis not present

## 2020-10-13 DIAGNOSIS — N182 Chronic kidney disease, stage 2 (mild): Secondary | ICD-10-CM | POA: Diagnosis not present

## 2020-10-13 DIAGNOSIS — Z20822 Contact with and (suspected) exposure to covid-19: Secondary | ICD-10-CM | POA: Diagnosis not present

## 2020-10-13 DIAGNOSIS — I712 Thoracic aortic aneurysm, without rupture: Secondary | ICD-10-CM | POA: Diagnosis not present

## 2020-10-13 DIAGNOSIS — R4701 Aphasia: Secondary | ICD-10-CM | POA: Diagnosis not present

## 2020-10-13 DIAGNOSIS — I639 Cerebral infarction, unspecified: Secondary | ICD-10-CM | POA: Diagnosis not present

## 2020-10-13 DIAGNOSIS — I7101 Dissection of thoracic aorta: Secondary | ICD-10-CM | POA: Diagnosis not present

## 2020-11-07 DIAGNOSIS — R4701 Aphasia: Secondary | ICD-10-CM | POA: Diagnosis not present

## 2020-11-07 DIAGNOSIS — K219 Gastro-esophageal reflux disease without esophagitis: Secondary | ICD-10-CM | POA: Diagnosis not present

## 2020-11-07 DIAGNOSIS — I2510 Atherosclerotic heart disease of native coronary artery without angina pectoris: Secondary | ICD-10-CM | POA: Diagnosis not present

## 2020-11-07 DIAGNOSIS — J95821 Acute postprocedural respiratory failure: Secondary | ICD-10-CM | POA: Diagnosis not present

## 2020-11-07 DIAGNOSIS — I6389 Other cerebral infarction: Secondary | ICD-10-CM | POA: Diagnosis not present

## 2020-11-07 DIAGNOSIS — Z9889 Other specified postprocedural states: Secondary | ICD-10-CM | POA: Diagnosis not present

## 2020-11-07 DIAGNOSIS — R29710 NIHSS score 10: Secondary | ICD-10-CM | POA: Diagnosis not present

## 2020-11-07 DIAGNOSIS — R4182 Altered mental status, unspecified: Secondary | ICD-10-CM | POA: Diagnosis not present

## 2020-11-07 DIAGNOSIS — J90 Pleural effusion, not elsewhere classified: Secondary | ICD-10-CM | POA: Diagnosis not present

## 2020-11-07 DIAGNOSIS — I700 Atherosclerosis of aorta: Secondary | ICD-10-CM | POA: Diagnosis not present

## 2020-11-07 DIAGNOSIS — N182 Chronic kidney disease, stage 2 (mild): Secondary | ICD-10-CM | POA: Diagnosis not present

## 2020-11-07 DIAGNOSIS — I129 Hypertensive chronic kidney disease with stage 1 through stage 4 chronic kidney disease, or unspecified chronic kidney disease: Secondary | ICD-10-CM | POA: Diagnosis not present

## 2020-11-07 DIAGNOSIS — I7101 Dissection of thoracic aorta: Secondary | ICD-10-CM | POA: Diagnosis not present

## 2020-11-07 DIAGNOSIS — I712 Thoracic aortic aneurysm, without rupture: Secondary | ICD-10-CM | POA: Diagnosis not present

## 2020-11-07 DIAGNOSIS — D62 Acute posthemorrhagic anemia: Secondary | ICD-10-CM | POA: Diagnosis not present

## 2020-11-07 DIAGNOSIS — Z4659 Encounter for fitting and adjustment of other gastrointestinal appliance and device: Secondary | ICD-10-CM | POA: Diagnosis not present

## 2020-11-07 DIAGNOSIS — I639 Cerebral infarction, unspecified: Secondary | ICD-10-CM | POA: Diagnosis not present

## 2020-11-07 DIAGNOSIS — Z20822 Contact with and (suspected) exposure to covid-19: Secondary | ICD-10-CM | POA: Diagnosis not present

## 2020-11-07 DIAGNOSIS — Z7982 Long term (current) use of aspirin: Secondary | ICD-10-CM | POA: Diagnosis not present

## 2020-11-07 DIAGNOSIS — G9389 Other specified disorders of brain: Secondary | ICD-10-CM | POA: Diagnosis not present

## 2020-11-07 DIAGNOSIS — G8194 Hemiplegia, unspecified affecting left nondominant side: Secondary | ICD-10-CM | POA: Diagnosis not present

## 2020-11-08 DIAGNOSIS — I129 Hypertensive chronic kidney disease with stage 1 through stage 4 chronic kidney disease, or unspecified chronic kidney disease: Secondary | ICD-10-CM | POA: Diagnosis not present

## 2020-11-08 DIAGNOSIS — N182 Chronic kidney disease, stage 2 (mild): Secondary | ICD-10-CM | POA: Diagnosis not present

## 2020-11-08 DIAGNOSIS — D62 Acute posthemorrhagic anemia: Secondary | ICD-10-CM | POA: Diagnosis not present

## 2020-11-08 DIAGNOSIS — Z7982 Long term (current) use of aspirin: Secondary | ICD-10-CM | POA: Diagnosis not present

## 2020-11-08 DIAGNOSIS — I712 Thoracic aortic aneurysm, without rupture: Secondary | ICD-10-CM | POA: Diagnosis not present

## 2020-11-09 DIAGNOSIS — G8194 Hemiplegia, unspecified affecting left nondominant side: Secondary | ICD-10-CM | POA: Diagnosis not present

## 2020-11-09 DIAGNOSIS — I639 Cerebral infarction, unspecified: Secondary | ICD-10-CM | POA: Diagnosis not present

## 2020-11-09 DIAGNOSIS — I712 Thoracic aortic aneurysm, without rupture: Secondary | ICD-10-CM | POA: Diagnosis not present

## 2020-11-09 DIAGNOSIS — R4701 Aphasia: Secondary | ICD-10-CM | POA: Diagnosis not present

## 2020-11-09 DIAGNOSIS — R4182 Altered mental status, unspecified: Secondary | ICD-10-CM | POA: Diagnosis not present

## 2020-11-09 DIAGNOSIS — J90 Pleural effusion, not elsewhere classified: Secondary | ICD-10-CM | POA: Diagnosis not present

## 2020-11-10 DIAGNOSIS — G9389 Other specified disorders of brain: Secondary | ICD-10-CM | POA: Diagnosis not present

## 2020-11-11 DIAGNOSIS — J90 Pleural effusion, not elsewhere classified: Secondary | ICD-10-CM | POA: Diagnosis not present

## 2020-11-11 DIAGNOSIS — I2510 Atherosclerotic heart disease of native coronary artery without angina pectoris: Secondary | ICD-10-CM | POA: Diagnosis not present

## 2020-11-11 DIAGNOSIS — I639 Cerebral infarction, unspecified: Secondary | ICD-10-CM | POA: Diagnosis not present

## 2020-11-11 DIAGNOSIS — R29710 NIHSS score 10: Secondary | ICD-10-CM | POA: Diagnosis not present

## 2020-11-12 DIAGNOSIS — D649 Anemia, unspecified: Secondary | ICD-10-CM | POA: Diagnosis not present

## 2020-11-12 DIAGNOSIS — Z8673 Personal history of transient ischemic attack (TIA), and cerebral infarction without residual deficits: Secondary | ICD-10-CM | POA: Diagnosis not present

## 2020-11-12 DIAGNOSIS — Z48812 Encounter for surgical aftercare following surgery on the circulatory system: Secondary | ICD-10-CM | POA: Diagnosis not present

## 2020-11-12 DIAGNOSIS — M170 Bilateral primary osteoarthritis of knee: Secondary | ICD-10-CM | POA: Diagnosis not present

## 2020-11-12 DIAGNOSIS — D72829 Elevated white blood cell count, unspecified: Secondary | ICD-10-CM | POA: Diagnosis not present

## 2020-11-12 DIAGNOSIS — R918 Other nonspecific abnormal finding of lung field: Secondary | ICD-10-CM | POA: Diagnosis not present

## 2020-11-12 DIAGNOSIS — R7401 Elevation of levels of liver transaminase levels: Secondary | ICD-10-CM | POA: Diagnosis not present

## 2020-11-12 DIAGNOSIS — I2510 Atherosclerotic heart disease of native coronary artery without angina pectoris: Secondary | ICD-10-CM | POA: Diagnosis not present

## 2020-11-12 DIAGNOSIS — C3490 Malignant neoplasm of unspecified part of unspecified bronchus or lung: Secondary | ICD-10-CM | POA: Diagnosis not present

## 2020-11-12 DIAGNOSIS — N3000 Acute cystitis without hematuria: Secondary | ICD-10-CM | POA: Diagnosis not present

## 2020-11-12 DIAGNOSIS — C3492 Malignant neoplasm of unspecified part of left bronchus or lung: Secondary | ICD-10-CM | POA: Diagnosis not present

## 2020-11-12 DIAGNOSIS — M810 Age-related osteoporosis without current pathological fracture: Secondary | ICD-10-CM | POA: Diagnosis not present

## 2020-11-12 DIAGNOSIS — I10 Essential (primary) hypertension: Secondary | ICD-10-CM | POA: Diagnosis not present

## 2020-11-12 DIAGNOSIS — E785 Hyperlipidemia, unspecified: Secondary | ICD-10-CM | POA: Diagnosis not present

## 2020-11-12 DIAGNOSIS — Z8679 Personal history of other diseases of the circulatory system: Secondary | ICD-10-CM | POA: Diagnosis not present

## 2020-11-12 DIAGNOSIS — I712 Thoracic aortic aneurysm, without rupture: Secondary | ICD-10-CM | POA: Diagnosis not present

## 2020-11-12 DIAGNOSIS — Z902 Acquired absence of lung [part of]: Secondary | ICD-10-CM | POA: Diagnosis not present

## 2020-11-12 DIAGNOSIS — R9431 Abnormal electrocardiogram [ECG] [EKG]: Secondary | ICD-10-CM | POA: Diagnosis not present

## 2020-11-12 DIAGNOSIS — Z9889 Other specified postprocedural states: Secondary | ICD-10-CM | POA: Diagnosis not present

## 2020-11-12 DIAGNOSIS — G4733 Obstructive sleep apnea (adult) (pediatric): Secondary | ICD-10-CM | POA: Diagnosis not present

## 2020-11-16 DIAGNOSIS — I2510 Atherosclerotic heart disease of native coronary artery without angina pectoris: Secondary | ICD-10-CM | POA: Diagnosis not present

## 2020-11-16 DIAGNOSIS — C3490 Malignant neoplasm of unspecified part of unspecified bronchus or lung: Secondary | ICD-10-CM | POA: Diagnosis not present

## 2020-11-16 DIAGNOSIS — I712 Thoracic aortic aneurysm, without rupture: Secondary | ICD-10-CM | POA: Diagnosis not present

## 2020-11-18 DIAGNOSIS — Z8673 Personal history of transient ischemic attack (TIA), and cerebral infarction without residual deficits: Secondary | ICD-10-CM | POA: Diagnosis not present

## 2020-11-18 DIAGNOSIS — D649 Anemia, unspecified: Secondary | ICD-10-CM | POA: Diagnosis not present

## 2020-11-18 DIAGNOSIS — I10 Essential (primary) hypertension: Secondary | ICD-10-CM | POA: Diagnosis not present

## 2020-11-18 DIAGNOSIS — C3492 Malignant neoplasm of unspecified part of left bronchus or lung: Secondary | ICD-10-CM | POA: Diagnosis not present

## 2020-11-18 DIAGNOSIS — N3000 Acute cystitis without hematuria: Secondary | ICD-10-CM | POA: Diagnosis not present

## 2020-11-18 DIAGNOSIS — Z9889 Other specified postprocedural states: Secondary | ICD-10-CM | POA: Diagnosis not present

## 2020-11-18 DIAGNOSIS — D72829 Elevated white blood cell count, unspecified: Secondary | ICD-10-CM | POA: Diagnosis not present

## 2020-11-18 DIAGNOSIS — I712 Thoracic aortic aneurysm, without rupture: Secondary | ICD-10-CM | POA: Diagnosis not present

## 2020-11-22 DIAGNOSIS — I712 Thoracic aortic aneurysm, without rupture: Secondary | ICD-10-CM | POA: Diagnosis not present

## 2020-11-22 DIAGNOSIS — R7401 Elevation of levels of liver transaminase levels: Secondary | ICD-10-CM | POA: Diagnosis not present

## 2020-11-22 DIAGNOSIS — N3000 Acute cystitis without hematuria: Secondary | ICD-10-CM | POA: Diagnosis not present

## 2020-12-06 DIAGNOSIS — D649 Anemia, unspecified: Secondary | ICD-10-CM | POA: Diagnosis not present

## 2020-12-06 DIAGNOSIS — Z9889 Other specified postprocedural states: Secondary | ICD-10-CM | POA: Diagnosis not present

## 2020-12-06 DIAGNOSIS — I712 Thoracic aortic aneurysm, without rupture: Secondary | ICD-10-CM | POA: Diagnosis not present

## 2020-12-06 DIAGNOSIS — Z8673 Personal history of transient ischemic attack (TIA), and cerebral infarction without residual deficits: Secondary | ICD-10-CM | POA: Diagnosis not present

## 2020-12-06 DIAGNOSIS — E785 Hyperlipidemia, unspecified: Secondary | ICD-10-CM | POA: Diagnosis not present

## 2020-12-06 DIAGNOSIS — C3492 Malignant neoplasm of unspecified part of left bronchus or lung: Secondary | ICD-10-CM | POA: Diagnosis not present

## 2020-12-06 DIAGNOSIS — I10 Essential (primary) hypertension: Secondary | ICD-10-CM | POA: Diagnosis not present

## 2020-12-06 DIAGNOSIS — N3000 Acute cystitis without hematuria: Secondary | ICD-10-CM | POA: Diagnosis not present

## 2020-12-09 ENCOUNTER — Other Ambulatory Visit: Payer: Self-pay | Admitting: *Deleted

## 2020-12-09 NOTE — Patient Outreach (Addendum)
[facility]) Care Management  12/09/2020  Kara Nelson November 17, 1950 [id number]  Referral received 3/3 d/c SNF 3/2  Transition of care- Unsuccessful Outreach #1  RN attempted outreach call today however invalid number and unable to leave a HIPAA voice message. Attempted to call pt's noted primary provider [facility] (Dr. Nancy Fetter) however not active. Spoke with representative who indicated pt is has not been to this office since 2018 and that was a no show. No other contact numbers noted with this office and no indications that pt will need any interpreter for a telephone call.   Continue to research Epic with pt visiting a [facility] provider Dr. Mina Marble on 3/1.   Based upon this provider active with this pt they are no within network for [facility] services will closed this case  Kara Mina, RN Care Management Coordinator [place] Office [phone]

## 2020-12-19 DIAGNOSIS — I6340 Cerebral infarction due to embolism of unspecified cerebral artery: Secondary | ICD-10-CM | POA: Diagnosis not present

## 2020-12-19 DIAGNOSIS — K120 Recurrent oral aphthae: Secondary | ICD-10-CM | POA: Diagnosis not present

## 2020-12-19 DIAGNOSIS — R7989 Other specified abnormal findings of blood chemistry: Secondary | ICD-10-CM | POA: Diagnosis not present

## 2020-12-19 DIAGNOSIS — I712 Thoracic aortic aneurysm, without rupture: Secondary | ICD-10-CM | POA: Diagnosis not present

## 2020-12-19 DIAGNOSIS — E876 Hypokalemia: Secondary | ICD-10-CM | POA: Diagnosis not present

## 2020-12-19 DIAGNOSIS — D500 Iron deficiency anemia secondary to blood loss (chronic): Secondary | ICD-10-CM | POA: Diagnosis not present

## 2020-12-19 DIAGNOSIS — Z902 Acquired absence of lung [part of]: Secondary | ICD-10-CM | POA: Diagnosis not present

## 2020-12-19 DIAGNOSIS — Z09 Encounter for follow-up examination after completed treatment for conditions other than malignant neoplasm: Secondary | ICD-10-CM | POA: Diagnosis not present

## 2020-12-19 DIAGNOSIS — I10 Essential (primary) hypertension: Secondary | ICD-10-CM | POA: Diagnosis not present

## 2020-12-19 DIAGNOSIS — E782 Mixed hyperlipidemia: Secondary | ICD-10-CM | POA: Diagnosis not present

## 2020-12-20 DIAGNOSIS — Z902 Acquired absence of lung [part of]: Secondary | ICD-10-CM | POA: Diagnosis not present

## 2020-12-20 DIAGNOSIS — I712 Thoracic aortic aneurysm, without rupture: Secondary | ICD-10-CM | POA: Diagnosis not present

## 2020-12-20 DIAGNOSIS — I639 Cerebral infarction, unspecified: Secondary | ICD-10-CM | POA: Diagnosis not present

## 2020-12-20 DIAGNOSIS — I2510 Atherosclerotic heart disease of native coronary artery without angina pectoris: Secondary | ICD-10-CM | POA: Diagnosis not present

## 2020-12-20 DIAGNOSIS — I6340 Cerebral infarction due to embolism of unspecified cerebral artery: Secondary | ICD-10-CM | POA: Diagnosis not present

## 2020-12-20 DIAGNOSIS — I10 Essential (primary) hypertension: Secondary | ICD-10-CM | POA: Diagnosis not present

## 2020-12-20 DIAGNOSIS — E782 Mixed hyperlipidemia: Secondary | ICD-10-CM | POA: Diagnosis not present

## 2020-12-30 DIAGNOSIS — Z888 Allergy status to other drugs, medicaments and biological substances status: Secondary | ICD-10-CM | POA: Diagnosis not present

## 2020-12-30 DIAGNOSIS — Z8673 Personal history of transient ischemic attack (TIA), and cerebral infarction without residual deficits: Secondary | ICD-10-CM | POA: Diagnosis not present

## 2020-12-30 DIAGNOSIS — Z79899 Other long term (current) drug therapy: Secondary | ICD-10-CM | POA: Diagnosis not present

## 2020-12-30 DIAGNOSIS — I129 Hypertensive chronic kidney disease with stage 1 through stage 4 chronic kidney disease, or unspecified chronic kidney disease: Secondary | ICD-10-CM | POA: Diagnosis not present

## 2020-12-30 DIAGNOSIS — Z885 Allergy status to narcotic agent status: Secondary | ICD-10-CM | POA: Diagnosis not present

## 2020-12-30 DIAGNOSIS — E785 Hyperlipidemia, unspecified: Secondary | ICD-10-CM | POA: Diagnosis not present

## 2020-12-30 DIAGNOSIS — Z09 Encounter for follow-up examination after completed treatment for conditions other than malignant neoplasm: Secondary | ICD-10-CM | POA: Diagnosis not present

## 2020-12-30 DIAGNOSIS — N189 Chronic kidney disease, unspecified: Secondary | ICD-10-CM | POA: Diagnosis not present

## 2020-12-30 DIAGNOSIS — I2510 Atherosclerotic heart disease of native coronary artery without angina pectoris: Secondary | ICD-10-CM | POA: Diagnosis not present

## 2020-12-30 DIAGNOSIS — I6340 Cerebral infarction due to embolism of unspecified cerebral artery: Secondary | ICD-10-CM | POA: Diagnosis not present

## 2021-03-03 DIAGNOSIS — I4892 Unspecified atrial flutter: Secondary | ICD-10-CM | POA: Diagnosis not present

## 2021-03-03 DIAGNOSIS — Z1211 Encounter for screening for malignant neoplasm of colon: Secondary | ICD-10-CM | POA: Diagnosis not present

## 2021-03-03 DIAGNOSIS — I6340 Cerebral infarction due to embolism of unspecified cerebral artery: Secondary | ICD-10-CM | POA: Diagnosis not present

## 2021-03-03 DIAGNOSIS — I10 Essential (primary) hypertension: Secondary | ICD-10-CM | POA: Diagnosis not present

## 2021-03-03 DIAGNOSIS — I712 Thoracic aortic aneurysm, without rupture: Secondary | ICD-10-CM | POA: Diagnosis not present

## 2021-03-20 DIAGNOSIS — I2510 Atherosclerotic heart disease of native coronary artery without angina pectoris: Secondary | ICD-10-CM | POA: Diagnosis not present

## 2021-03-20 DIAGNOSIS — C3432 Malignant neoplasm of lower lobe, left bronchus or lung: Secondary | ICD-10-CM | POA: Diagnosis not present

## 2021-03-20 DIAGNOSIS — C3490 Malignant neoplasm of unspecified part of unspecified bronchus or lung: Secondary | ICD-10-CM | POA: Diagnosis not present

## 2021-03-20 DIAGNOSIS — R918 Other nonspecific abnormal finding of lung field: Secondary | ICD-10-CM | POA: Diagnosis not present

## 2021-03-20 DIAGNOSIS — J984 Other disorders of lung: Secondary | ICD-10-CM | POA: Diagnosis not present

## 2021-03-20 DIAGNOSIS — Z9889 Other specified postprocedural states: Secondary | ICD-10-CM | POA: Diagnosis not present

## 2021-03-20 DIAGNOSIS — I700 Atherosclerosis of aorta: Secondary | ICD-10-CM | POA: Diagnosis not present

## 2021-03-20 DIAGNOSIS — J9859 Other diseases of mediastinum, not elsewhere classified: Secondary | ICD-10-CM | POA: Diagnosis not present

## 2021-03-23 DIAGNOSIS — Z85118 Personal history of other malignant neoplasm of bronchus and lung: Secondary | ICD-10-CM | POA: Diagnosis not present

## 2021-03-23 DIAGNOSIS — Z08 Encounter for follow-up examination after completed treatment for malignant neoplasm: Secondary | ICD-10-CM | POA: Diagnosis not present

## 2021-03-23 DIAGNOSIS — Z902 Acquired absence of lung [part of]: Secondary | ICD-10-CM | POA: Diagnosis not present

## 2021-04-04 ENCOUNTER — Other Ambulatory Visit: Payer: Self-pay | Admitting: *Deleted

## 2021-04-04 NOTE — Patient Outreach (Signed)
[facility]) Care Management  04/04/2021  Kara Nelson 1950-12-29 [id number]   Referral Date: 6/24 Referral Source: Insurance Referral Reason: Chronic care management Insurance: [place] attempt #1, unsuccessful, unable to leave message.  Recording state number is unreachable at this time.  No alternate number, no DPR on file.    Plan: RN CM will send outreach letter and follow up within the next 3-4 business days.  Valente David, [place], MSN [place] [phone]

## 2021-04-11 ENCOUNTER — Other Ambulatory Visit: Payer: Self-pay | Admitting: *Deleted

## 2021-04-11 DIAGNOSIS — I6340 Cerebral infarction due to embolism of unspecified cerebral artery: Secondary | ICD-10-CM | POA: Diagnosis not present

## 2021-04-11 DIAGNOSIS — I10 Essential (primary) hypertension: Secondary | ICD-10-CM | POA: Diagnosis not present

## 2021-04-11 DIAGNOSIS — Z902 Acquired absence of lung [part of]: Secondary | ICD-10-CM | POA: Diagnosis not present

## 2021-04-11 DIAGNOSIS — I4892 Unspecified atrial flutter: Secondary | ICD-10-CM | POA: Diagnosis not present

## 2021-04-11 DIAGNOSIS — I2510 Atherosclerotic heart disease of native coronary artery without angina pectoris: Secondary | ICD-10-CM | POA: Diagnosis not present

## 2021-04-11 DIAGNOSIS — E782 Mixed hyperlipidemia: Secondary | ICD-10-CM | POA: Diagnosis not present

## 2021-04-11 DIAGNOSIS — I712 Thoracic aortic aneurysm, without rupture: Secondary | ICD-10-CM | POA: Diagnosis not present

## 2021-04-11 NOTE — Patient Outreach (Addendum)
[place] [facility]) Care Management  04/11/2021  Kara Nelson 07-18-1951 [id number]    Referral Date: 6/24 Referral Source: Insurance Referral Reason: Chronic care management Insurance: [place] attempt #2, unsuccessful, unable to leave message.  Recording state number is unreachable at this time.  No alternate number, no DPR on file.  Call also placed to PCP office (primary is Dr. Rudene Anda with [place] in [facility]), confirmed that listed number is member's correct number.  Will follow up within the next 3-4 business days.  Valente David, [place], MSN [place] [phone]

## 2021-04-17 ENCOUNTER — Other Ambulatory Visit: Payer: Self-pay | Admitting: *Deleted

## 2021-04-17 NOTE — Patient Outreach (Signed)
[place] [facility]) Care Management  04/17/2021  TALASIA SAULTER [birth year] [id number]   Referral Date: 6/24 Referral Source: Insurance Referral Reason: Chronic care management Insurance: [place] attempt #3, unsuccessful, unable to leave message.  Will make 4th and final attempt within the next 4 weeks.  If remain unsuccessful, will close case due to inability to maintain contact.  Valente David, [place], MSN [place] [phone]

## 2021-04-20 DIAGNOSIS — D500 Iron deficiency anemia secondary to blood loss (chronic): Secondary | ICD-10-CM | POA: Diagnosis not present

## 2021-04-20 DIAGNOSIS — I10 Essential (primary) hypertension: Secondary | ICD-10-CM | POA: Diagnosis not present

## 2021-04-20 DIAGNOSIS — M810 Age-related osteoporosis without current pathological fracture: Secondary | ICD-10-CM | POA: Diagnosis not present

## 2021-04-20 DIAGNOSIS — Z0000 Encounter for general adult medical examination without abnormal findings: Secondary | ICD-10-CM | POA: Diagnosis not present

## 2021-04-20 DIAGNOSIS — Z23 Encounter for immunization: Secondary | ICD-10-CM | POA: Diagnosis not present

## 2021-04-20 DIAGNOSIS — Z1231 Encounter for screening mammogram for malignant neoplasm of breast: Secondary | ICD-10-CM | POA: Diagnosis not present

## 2021-04-20 DIAGNOSIS — E782 Mixed hyperlipidemia: Secondary | ICD-10-CM | POA: Diagnosis not present

## 2021-04-21 ENCOUNTER — Other Ambulatory Visit: Payer: Self-pay | Admitting: *Deleted

## 2021-04-21 NOTE — Patient Outreach (Signed)
[place] [facility]) Care Management  04/21/2021  Kara Nelson Mar 26, 1951 [id number]   Encounter opened in error  Joelene Millin L. Lavina Hamman, RN, BSN, [place] Coordinator Office number [phone] [facility] number [phone] Fax number [phone]

## 2021-05-05 DIAGNOSIS — Z1231 Encounter for screening mammogram for malignant neoplasm of breast: Secondary | ICD-10-CM | POA: Diagnosis not present

## 2021-05-15 ENCOUNTER — Other Ambulatory Visit: Payer: Self-pay | Admitting: *Deleted

## 2021-05-15 NOTE — Patient Outreach (Signed)
[place] [facility]) Care Management  05/15/2021  TRINE FREAD 1951-09-23 [id number]   Referral Date: 6/24 Referral Source: Insurance Referral Reason: Chronic care management Insurance: [place] attempt #4, unsuccessful, unable to leave message.  No response from member after multiple unsuccessful outreach attempts and letter sent.  Will close case at this time due to inability to maintain contact.  Will notify member and primary MD of case closure.  Valente David, [place], MSN [place] [phone]

## 2021-05-22 DIAGNOSIS — Z7189 Other specified counseling: Secondary | ICD-10-CM | POA: Diagnosis not present

## 2021-05-22 DIAGNOSIS — M810 Age-related osteoporosis without current pathological fracture: Secondary | ICD-10-CM | POA: Diagnosis not present

## 2021-06-05 DIAGNOSIS — Z8673 Personal history of transient ischemic attack (TIA), and cerebral infarction without residual deficits: Secondary | ICD-10-CM | POA: Diagnosis not present

## 2021-08-23 DIAGNOSIS — I4892 Unspecified atrial flutter: Secondary | ICD-10-CM | POA: Diagnosis not present

## 2021-08-23 DIAGNOSIS — I10 Essential (primary) hypertension: Secondary | ICD-10-CM | POA: Diagnosis not present

## 2021-08-23 DIAGNOSIS — I2510 Atherosclerotic heart disease of native coronary artery without angina pectoris: Secondary | ICD-10-CM | POA: Diagnosis not present

## 2021-09-18 DIAGNOSIS — Z902 Acquired absence of lung [part of]: Secondary | ICD-10-CM | POA: Diagnosis not present

## 2021-09-18 DIAGNOSIS — R918 Other nonspecific abnormal finding of lung field: Secondary | ICD-10-CM | POA: Diagnosis not present

## 2021-09-18 DIAGNOSIS — R911 Solitary pulmonary nodule: Secondary | ICD-10-CM | POA: Diagnosis not present

## 2021-09-18 DIAGNOSIS — I700 Atherosclerosis of aorta: Secondary | ICD-10-CM | POA: Diagnosis not present

## 2021-09-28 DIAGNOSIS — Z08 Encounter for follow-up examination after completed treatment for malignant neoplasm: Secondary | ICD-10-CM | POA: Diagnosis not present

## 2021-09-28 DIAGNOSIS — Z85118 Personal history of other malignant neoplasm of bronchus and lung: Secondary | ICD-10-CM | POA: Diagnosis not present

## 2021-09-28 DIAGNOSIS — Z902 Acquired absence of lung [part of]: Secondary | ICD-10-CM | POA: Diagnosis not present
